# Patient Record
Sex: Female | Born: 1975 | Race: Black or African American | Hispanic: No | Marital: Married | Smoking: Former smoker
Health system: Southern US, Community
[De-identification: ages and names within clinical notes are randomized; demographics above are authoritative.]

## PROBLEM LIST (undated history)

## (undated) DIAGNOSIS — D573 Sickle-cell trait: Secondary | ICD-10-CM

## (undated) DIAGNOSIS — K219 Gastro-esophageal reflux disease without esophagitis: Secondary | ICD-10-CM

## (undated) DIAGNOSIS — E785 Hyperlipidemia, unspecified: Secondary | ICD-10-CM

## (undated) HISTORY — DX: Hyperlipidemia, unspecified: E78.5

## (undated) HISTORY — PX: OTHER SURGICAL HISTORY: SHX169

## (undated) HISTORY — DX: Gastro-esophageal reflux disease without esophagitis: K21.9

## (undated) HISTORY — DX: Sickle-cell trait: D57.3

---

## 1998-12-10 ENCOUNTER — Other Ambulatory Visit: Admission: RE | Admit: 1998-12-10 | Discharge: 1998-12-10 | Payer: Self-pay | Admitting: Obstetrics and Gynecology

## 1999-03-07 ENCOUNTER — Other Ambulatory Visit: Admission: RE | Admit: 1999-03-07 | Discharge: 1999-03-07 | Payer: Self-pay | Admitting: Obstetrics and Gynecology

## 1999-03-07 ENCOUNTER — Encounter (INDEPENDENT_AMBULATORY_CARE_PROVIDER_SITE_OTHER): Payer: Self-pay | Admitting: Specialist

## 1999-05-02 ENCOUNTER — Other Ambulatory Visit: Admission: RE | Admit: 1999-05-02 | Discharge: 1999-05-02 | Payer: Self-pay | Admitting: Obstetrics and Gynecology

## 1999-09-07 ENCOUNTER — Encounter: Admission: RE | Admit: 1999-09-07 | Discharge: 1999-09-07 | Payer: Self-pay | Admitting: Family Medicine

## 1999-10-12 ENCOUNTER — Encounter: Admission: RE | Admit: 1999-10-12 | Discharge: 1999-10-12 | Payer: Self-pay | Admitting: Family Medicine

## 2000-02-21 ENCOUNTER — Encounter: Admission: RE | Admit: 2000-02-21 | Discharge: 2000-02-21 | Payer: Self-pay | Admitting: Family Medicine

## 2000-05-03 ENCOUNTER — Encounter: Admission: RE | Admit: 2000-05-03 | Discharge: 2000-05-03 | Payer: Self-pay | Admitting: Family Medicine

## 2000-12-12 ENCOUNTER — Encounter: Admission: RE | Admit: 2000-12-12 | Discharge: 2000-12-12 | Payer: Self-pay | Admitting: Family Medicine

## 2001-03-22 ENCOUNTER — Other Ambulatory Visit: Admission: RE | Admit: 2001-03-22 | Discharge: 2001-03-22 | Payer: Self-pay | Admitting: Gynecology

## 2001-07-31 ENCOUNTER — Inpatient Hospital Stay (HOSPITAL_COMMUNITY): Admission: AD | Admit: 2001-07-31 | Discharge: 2001-07-31 | Payer: Self-pay | Admitting: *Deleted

## 2001-10-08 ENCOUNTER — Encounter (INDEPENDENT_AMBULATORY_CARE_PROVIDER_SITE_OTHER): Payer: Self-pay

## 2001-10-08 ENCOUNTER — Inpatient Hospital Stay (HOSPITAL_COMMUNITY): Admission: AD | Admit: 2001-10-08 | Discharge: 2001-10-10 | Payer: Self-pay | Admitting: *Deleted

## 2001-11-18 ENCOUNTER — Other Ambulatory Visit: Admission: RE | Admit: 2001-11-18 | Discharge: 2001-11-18 | Payer: Self-pay | Admitting: Gynecology

## 2003-01-27 ENCOUNTER — Inpatient Hospital Stay (HOSPITAL_COMMUNITY): Admission: AD | Admit: 2003-01-27 | Discharge: 2003-01-27 | Payer: Self-pay | Admitting: Gynecology

## 2003-02-13 ENCOUNTER — Inpatient Hospital Stay (HOSPITAL_COMMUNITY): Admission: AD | Admit: 2003-02-13 | Discharge: 2003-02-15 | Payer: Self-pay | Admitting: Gynecology

## 2003-03-26 ENCOUNTER — Other Ambulatory Visit: Admission: RE | Admit: 2003-03-26 | Discharge: 2003-03-26 | Payer: Self-pay | Admitting: Gynecology

## 2004-07-04 ENCOUNTER — Other Ambulatory Visit: Admission: RE | Admit: 2004-07-04 | Discharge: 2004-07-04 | Payer: Self-pay | Admitting: Gynecology

## 2004-08-25 ENCOUNTER — Emergency Department (HOSPITAL_COMMUNITY): Admission: EM | Admit: 2004-08-25 | Discharge: 2004-08-25 | Payer: Self-pay | Admitting: Emergency Medicine

## 2004-08-25 ENCOUNTER — Inpatient Hospital Stay (HOSPITAL_COMMUNITY): Admission: AD | Admit: 2004-08-25 | Discharge: 2004-08-25 | Payer: Self-pay | Admitting: Gynecology

## 2005-01-18 ENCOUNTER — Inpatient Hospital Stay (HOSPITAL_COMMUNITY): Admission: AD | Admit: 2005-01-18 | Discharge: 2005-01-18 | Payer: Self-pay | Admitting: Gynecology

## 2005-01-23 ENCOUNTER — Inpatient Hospital Stay (HOSPITAL_COMMUNITY): Admission: AD | Admit: 2005-01-23 | Discharge: 2005-01-25 | Payer: Self-pay | Admitting: Gynecology

## 2005-03-07 ENCOUNTER — Other Ambulatory Visit: Admission: RE | Admit: 2005-03-07 | Discharge: 2005-03-07 | Payer: Self-pay | Admitting: Gynecology

## 2006-03-09 ENCOUNTER — Other Ambulatory Visit: Admission: RE | Admit: 2006-03-09 | Discharge: 2006-03-09 | Payer: Self-pay | Admitting: Gynecology

## 2006-03-10 ENCOUNTER — Emergency Department (HOSPITAL_COMMUNITY): Admission: EM | Admit: 2006-03-10 | Discharge: 2006-03-10 | Payer: Self-pay | Admitting: Emergency Medicine

## 2007-03-21 ENCOUNTER — Other Ambulatory Visit: Admission: RE | Admit: 2007-03-21 | Discharge: 2007-03-21 | Payer: Self-pay | Admitting: Gynecology

## 2008-03-27 ENCOUNTER — Other Ambulatory Visit: Admission: RE | Admit: 2008-03-27 | Discharge: 2008-03-27 | Payer: Self-pay | Admitting: Gynecology

## 2009-05-10 ENCOUNTER — Other Ambulatory Visit: Admission: RE | Admit: 2009-05-10 | Discharge: 2009-05-10 | Payer: Self-pay | Admitting: Gynecology

## 2009-05-10 ENCOUNTER — Encounter: Payer: Self-pay | Admitting: Women's Health

## 2009-05-10 ENCOUNTER — Ambulatory Visit: Payer: Self-pay | Admitting: Women's Health

## 2010-05-25 ENCOUNTER — Other Ambulatory Visit
Admission: RE | Admit: 2010-05-25 | Discharge: 2010-05-25 | Payer: Self-pay | Source: Home / Self Care | Admitting: Gynecology

## 2010-05-25 ENCOUNTER — Ambulatory Visit: Payer: Self-pay | Admitting: Women's Health

## 2010-12-30 NOTE — Discharge Summary (Signed)
   Lindsay Wood, Lindsay Wood                            ACCOUNT NO.:  [id number]   MEDICAL RECORD NO.:  [id number]                   PATIENT TYPE:  INP   LOCATION:  9122                                 FACILITY:  [facility]   PHYSICIAN:  Timothy P. Fontaine, M.D.           DATE OF BIRTH:  03-03-1976   DATE OF ADMISSION:  02/13/2003  DATE OF DISCHARGE:  02/15/2003                                 DISCHARGE SUMMARY   DISCHARGE DIAGNOSIS:  Intrauterine pregnancy at term.   PROCEDURE:  Spontaneous vaginal delivery with no episiotomy or laceration.  She was augmented with Pitocin.   HISTORY OF PRESENT ILLNESS:  A 35 year old gravida 3 para 2 with an LMP of  May 05, 2002 with an [facility] of February 23, 2003; estimated gestational age  is 19 and three; with premature rupture of membranes that was clear fluid.  She did begin to contract shortly thereafter.  Progressed to complete,  delivered.   PRENATAL LABORATORY DATA:  AB positive, antibody negative.  Serology  nonreactive.  Rubella titer positive.  Hepatitis nonreactive.  HIV  nonreactive.  Group B strep negative.   HOSPITAL COURSE AND TREATMENT:  The patient presented on February 13, 2003 with  spontaneous rupture of clear fluid.  Did start to have contractions shortly  thereafter.  Had no complications during her pregnancy.  She progressed to  complete.  Had a spontaneous vaginal delivery without an episiotomy, viable  female with Apgars of 9 and 9, with a birth weight of 7 pounds 1 ounce.  She  did have a nuchal cord x1 that was reduced.  Postpartum she remained  afebrile, voiding, was discharged home in satisfactory condition on  postpartum day #2.   DISCHARGE LABORATORY DATA:  White count 13, hemoglobin 10.7, hematocrit  33.5, platelets 202.   DISPOSITION:  1. She was discharged to home.  2. Instructed to continue prenatal vitamins, iron, Motrin as needed for     pain.  3. Follow up in the office in six weeks or as needed.  4. [facility]GA discharge  booklet was given.     Davonna Belling. Young, N.P.                      Timothy P. Audie Box, M.D.    [facility]  D:  03/18/2003  T:  03/18/2003  Job:  [id number]

## 2010-12-30 NOTE — H&P (Signed)
NAMEKIMBERLYE, Lindsay Wood                ACCOUNT NO.:  [id number]   MEDICAL RECORD NO.:  [id number]          PATIENT TYPE:  INP   LOCATION:  9165                          [facility]ILITY:  [facility]   PHYSICIAN:  Ivor Costa. Farrel Gobble, M.D. DATE OF BIRTH:  Nov 08, 1975   DATE OF ADMISSION:  01/23/2005  DATE OF DISCHARGE:                                HISTORY & PHYSICAL   CHIEF COMPLAINT:  Favorable cervix.   HISTORY OF PRESENT ILLNESS:  The patient is a 35 year old G4 P3 with an LMP  of April 23, 2004 estimated date of confinement of February 02, 2005,  estimated gestational age of 27 and 4/7th weeks, who was seen twice in both  the office and triage for what turns out to be false labor.  The patient was  noted to be approximately 2 cm dilated, 50% efface, 2 hours of walking had  not changed her cervix.  She was having increasing anxiety as her last two  deliveries have been rather precipitous at 1 and 2-1/2 hours respectively.  She had also delivered her last baby at the same gestational age.  Based on  that, the patient requested an induction of labor.  Her pregnancy is  otherwise complicated only by positive GBS status.  She reports good fetal  movement, and does report contractions without vaginal bleeding.  She is AB  positive, antibody negative, RPR nonreactive, rubella immune, hepatitis B  surface antigen nonreactive, HIV nonreactive GBS positive.  Refer to  [facility].  On physical examination she is a well-appearing gravida in no acute  distress.  Her weight is 142, which is a 17 pound weight gain.  Heart was  regular rate, her lungs were clear to auscultation.  Her fundal height was  36 with heart tones auscultated.  Her exam was 2/50/- 2.  Extremities are  negative for edema.   ASSESSMENT:  History of precipitous labor x2 with increasing anxiety  secondary to false labor, who presents now for high-dose pitocin with AROM.       THL/MEDQ  D:  01/23/2005  T:  01/23/2005  Job:  [id number]

## 2010-12-30 NOTE — Discharge Summary (Signed)
[facility]  Patient:    MARIONNA, GONIA Visit Number: [id number] MRN: [id number]          Service Type: OBS Location: 910A 9112 01 Attending Physician:  Tonye Royalty Dictated by:   Antony Contras, [facility] Admit Date:  10/08/2001 Discharge Date: 10/10/2001                             Discharge Summary  DISCHARGE DIAGNOSES:          1. Intrauterine pregnancy at term.                               2. Spontaneous onset of labor.  PROCEDURE:                    Normal spontaneous vaginal delivery of a                               viable infant over intact perineum.  HISTORY OF PRESENT ILLNESS:   The patient is a 35 year old gravida 2, para 1-0-0-1, LMP Jan 07, 2001, [facility] October 16, 2001.  Prenatal risk factor history, positive GBS with previous pregnancy.  ADMISSION LABORATORY DATA:    Blood type AB positive.  Antibody screen negative.  Sickle cell negative.  RPR, HBsAG, HIV nonreactive.  Rubella immune.  The patient declined MSaFP.  HOSPITAL COURSE:              The patient was admitted October 08, 2001, in advanced active labor.  She progressed rapidly to complete dilatation and delivery of an Apgar 8 and 9, 6 pound 3 ounce female infant over an intact perineum.  Nuchal cord x1 reduced.  There were no tears.  The fluid was meconium-stained.  Pediatric team in attendance.  Postpartum course:  The patient remained afebrile, had no difficulty voiding, was able to be discharged in satisfactory condition on her second postpartum day.  CBC: Hematocrit 30.4, hemoglobin 10.1, WBC 12.1, platelets 201.  DISPOSITION:                  Follow up in six weeks.  Continue prenatal vitamins, Motrin for pain. Dictated by:   Antony Contras, [facility] Attending Physician:  Tonye Royalty DD:  10/23/01 TD:  10/25/01 Job: [phone] [id number]

## 2013-06-23 ENCOUNTER — Other Ambulatory Visit: Payer: Self-pay | Admitting: Family Medicine

## 2013-06-23 ENCOUNTER — Other Ambulatory Visit (HOSPITAL_COMMUNITY)
Admission: RE | Admit: 2013-06-23 | Discharge: 2013-06-23 | Disposition: A | Discharge status: Home / Self Care | Payer: Self-pay | Source: Ambulatory Visit | Attending: Family Medicine | Admitting: Family Medicine

## 2013-06-23 DIAGNOSIS — Z124 Encounter for screening for malignant neoplasm of cervix: Secondary | ICD-10-CM | POA: Insufficient documentation

## 2016-05-31 ENCOUNTER — Other Ambulatory Visit: Payer: Self-pay | Admitting: Physician Assistant

## 2016-05-31 ENCOUNTER — Other Ambulatory Visit (HOSPITAL_COMMUNITY)
Admission: RE | Admit: 2016-05-31 | Discharge: 2016-05-31 | Disposition: A | Discharge status: Home / Self Care | Payer: Medicaid Other | Source: Ambulatory Visit | Attending: Physician Assistant | Admitting: Physician Assistant

## 2016-05-31 DIAGNOSIS — Z01419 Encounter for gynecological examination (general) (routine) without abnormal findings: Secondary | ICD-10-CM | POA: Insufficient documentation

## 2016-05-31 DIAGNOSIS — Z1151 Encounter for screening for human papillomavirus (HPV): Secondary | ICD-10-CM | POA: Insufficient documentation

## 2016-06-02 LAB — CYTOLOGY - PAP
DIAGNOSIS: NEGATIVE
HPV: NOT DETECTED

## 2016-06-13 ENCOUNTER — Other Ambulatory Visit: Payer: Self-pay | Admitting: Physician Assistant

## 2016-06-13 DIAGNOSIS — Z1231 Encounter for screening mammogram for malignant neoplasm of breast: Secondary | ICD-10-CM

## 2016-07-13 ENCOUNTER — Ambulatory Visit
Admission: RE | Admit: 2016-07-13 | Discharge: 2016-07-13 | Disposition: A | Discharge status: Home / Self Care | Payer: Medicaid Other | Source: Ambulatory Visit | Attending: Physician Assistant | Admitting: Physician Assistant

## 2016-07-13 DIAGNOSIS — Z1231 Encounter for screening mammogram for malignant neoplasm of breast: Secondary | ICD-10-CM

## 2016-11-15 ENCOUNTER — Ambulatory Visit (INDEPENDENT_AMBULATORY_CARE_PROVIDER_SITE_OTHER): Payer: Medicaid Other | Admitting: Podiatry

## 2016-11-15 ENCOUNTER — Ambulatory Visit (INDEPENDENT_AMBULATORY_CARE_PROVIDER_SITE_OTHER): Payer: Medicaid Other

## 2016-11-15 ENCOUNTER — Encounter: Payer: Self-pay | Admitting: Podiatry

## 2016-11-15 VITALS — Ht 63.0 in | Wt 149.0 lb

## 2016-11-15 DIAGNOSIS — G8929 Other chronic pain: Secondary | ICD-10-CM | POA: Diagnosis not present

## 2016-11-15 DIAGNOSIS — M79672 Pain in left foot: Secondary | ICD-10-CM

## 2016-11-15 DIAGNOSIS — L84 Corns and callosities: Secondary | ICD-10-CM

## 2016-11-15 DIAGNOSIS — M79671 Pain in right foot: Secondary | ICD-10-CM | POA: Diagnosis not present

## 2016-11-15 DIAGNOSIS — L851 Acquired keratosis [keratoderma] palmaris et plantaris: Secondary | ICD-10-CM | POA: Diagnosis not present

## 2016-11-15 DIAGNOSIS — M79674 Pain in right toe(s): Secondary | ICD-10-CM | POA: Diagnosis not present

## 2016-11-18 NOTE — Progress Notes (Signed)
   Subjective: Patient presents to the office today for chief complaint of painful callus lesions of the right fifth toe. Patient states that the pain is ongoing for several years but has worsened over the past 4 months and is affecting their ability to ambulate without pain. She has tried to wear pads over the corn as treatment. Patient presents today for further treatment and evaluation.  Objective:  Physical Exam General: Alert and oriented x3 in no acute distress  Dermatology: Hyperkeratotic lesion present on the fifth digit right foot. Pain on palpation with a central nucleated core noted.  Skin is warm, dry and supple bilateral lower extremities. Negative for open lesions or macerations.  Vascular: Palpable pedal pulses bilaterally. No edema or erythema noted. Capillary refill within normal limits.  Neurological: Epicritic and protective threshold grossly intact bilaterally.   Musculoskeletal Exam: Pain on palpation at the keratotic lesion noted. Range of motion within normal limits bilateral. Muscle strength 5/5 in all groups bilateral.  Radiographic exam: Joint spaces preserved. No sign of osteolytic or degenerative process. Negative for fracture.  Assessment: #1 Corn- right fifth toe   Plan of Care:  #1 Patient evaluated #2 Excisional debridement of  keratoic lesion using a chisel blade was performed without incident.  #3 Treated area(s) with Salinocaine and dressed with light dressing. #4 Patient is to return to the clinic PRN.   Edrick Kins, DPM [facility]  Dr. Edrick Kins, [address]                                        [place], [place] [address]                Office [phone]  Fax [phone]

## 2017-06-08 ENCOUNTER — Other Ambulatory Visit: Payer: Self-pay | Admitting: Physician Assistant

## 2017-06-08 DIAGNOSIS — Z1231 Encounter for screening mammogram for malignant neoplasm of breast: Secondary | ICD-10-CM

## 2017-07-16 ENCOUNTER — Ambulatory Visit
Admission: RE | Admit: 2017-07-16 | Discharge: 2017-07-16 | Disposition: A | Discharge status: Home / Self Care | Payer: Medicaid Other | Source: Ambulatory Visit | Attending: Physician Assistant | Admitting: Physician Assistant

## 2017-07-16 DIAGNOSIS — Z1231 Encounter for screening mammogram for malignant neoplasm of breast: Secondary | ICD-10-CM

## 2018-06-21 ENCOUNTER — Other Ambulatory Visit: Payer: Self-pay | Admitting: Physician Assistant

## 2018-06-21 DIAGNOSIS — Z1231 Encounter for screening mammogram for malignant neoplasm of breast: Secondary | ICD-10-CM

## 2018-07-26 ENCOUNTER — Ambulatory Visit
Admission: RE | Admit: 2018-07-26 | Discharge: 2018-07-26 | Disposition: A | Discharge status: Home / Self Care | Payer: Medicaid Other | Source: Ambulatory Visit | Attending: Physician Assistant | Admitting: Physician Assistant

## 2018-07-26 DIAGNOSIS — Z1231 Encounter for screening mammogram for malignant neoplasm of breast: Secondary | ICD-10-CM

## 2018-07-29 ENCOUNTER — Other Ambulatory Visit: Payer: Self-pay | Admitting: Physician Assistant

## 2018-07-29 DIAGNOSIS — R928 Other abnormal and inconclusive findings on diagnostic imaging of breast: Secondary | ICD-10-CM

## 2018-08-08 ENCOUNTER — Ambulatory Visit
Admission: RE | Admit: 2018-08-08 | Discharge: 2018-08-08 | Disposition: A | Discharge status: Home / Self Care | Payer: Medicaid Other | Source: Ambulatory Visit | Attending: Physician Assistant | Admitting: Physician Assistant

## 2018-08-08 ENCOUNTER — Other Ambulatory Visit: Payer: Self-pay | Admitting: Physician Assistant

## 2018-08-08 DIAGNOSIS — R928 Other abnormal and inconclusive findings on diagnostic imaging of breast: Secondary | ICD-10-CM

## 2018-08-08 DIAGNOSIS — R2231 Localized swelling, mass and lump, right upper limb: Secondary | ICD-10-CM

## 2018-08-12 ENCOUNTER — Ambulatory Visit
Admission: RE | Admit: 2018-08-12 | Discharge: 2018-08-12 | Disposition: A | Discharge status: Home / Self Care | Payer: Medicaid Other | Source: Ambulatory Visit | Attending: Physician Assistant | Admitting: Physician Assistant

## 2018-08-12 ENCOUNTER — Other Ambulatory Visit (HOSPITAL_COMMUNITY)
Admission: RE | Admit: 2018-08-12 | Discharge: 2018-08-12 | Disposition: A | Discharge status: Home / Self Care | Payer: Medicaid Other | Source: Ambulatory Visit | Attending: Physician Assistant | Admitting: Physician Assistant

## 2018-08-12 DIAGNOSIS — R2231 Localized swelling, mass and lump, right upper limb: Secondary | ICD-10-CM

## 2018-08-12 HISTORY — PX: BREAST BIOPSY: SHX20

## 2019-02-12 ENCOUNTER — Other Ambulatory Visit: Payer: Self-pay | Admitting: Physician Assistant

## 2019-02-12 DIAGNOSIS — N6310 Unspecified lump in the right breast, unspecified quadrant: Secondary | ICD-10-CM

## 2019-02-20 ENCOUNTER — Other Ambulatory Visit: Payer: Self-pay | Admitting: Physician Assistant

## 2019-02-20 ENCOUNTER — Ambulatory Visit
Admission: RE | Admit: 2019-02-20 | Discharge: 2019-02-20 | Disposition: A | Discharge status: Home / Self Care | Payer: Medicaid Other | Source: Ambulatory Visit | Attending: Physician Assistant | Admitting: Physician Assistant

## 2019-02-20 DIAGNOSIS — N6310 Unspecified lump in the right breast, unspecified quadrant: Secondary | ICD-10-CM

## 2019-02-20 DIAGNOSIS — R2231 Localized swelling, mass and lump, right upper limb: Secondary | ICD-10-CM

## 2019-02-27 ENCOUNTER — Ambulatory Visit
Admission: RE | Admit: 2019-02-27 | Discharge: 2019-02-27 | Disposition: A | Discharge status: Home / Self Care | Payer: Medicaid Other | Source: Ambulatory Visit | Attending: Physician Assistant | Admitting: Physician Assistant

## 2019-02-27 ENCOUNTER — Other Ambulatory Visit: Payer: Self-pay | Admitting: Physician Assistant

## 2019-02-27 ENCOUNTER — Other Ambulatory Visit: Payer: Self-pay

## 2019-02-27 DIAGNOSIS — R2231 Localized swelling, mass and lump, right upper limb: Secondary | ICD-10-CM

## 2019-02-27 HISTORY — PX: BREAST BIOPSY: SHX20

## 2019-08-18 ENCOUNTER — Other Ambulatory Visit: Payer: Self-pay | Admitting: Physician Assistant

## 2019-08-18 DIAGNOSIS — Z1231 Encounter for screening mammogram for malignant neoplasm of breast: Secondary | ICD-10-CM

## 2019-08-20 ENCOUNTER — Ambulatory Visit
Admission: RE | Admit: 2019-08-20 | Discharge: 2019-08-20 | Disposition: A | Discharge status: Home / Self Care | Payer: Medicaid Other | Source: Ambulatory Visit | Attending: Physician Assistant | Admitting: Physician Assistant

## 2019-08-20 ENCOUNTER — Other Ambulatory Visit: Payer: Self-pay

## 2019-08-20 DIAGNOSIS — Z1231 Encounter for screening mammogram for malignant neoplasm of breast: Secondary | ICD-10-CM

## 2020-06-04 IMAGING — MG DIGITAL SCREENING BILATERAL MAMMOGRAM WITH TOMO AND CAD
8 series · 9 of 24 positions shown · non-contrast
Comparison: Previous exam(s).

CLINICAL DATA: Screening.

EXAM:
DIGITAL SCREENING BILATERAL MAMMOGRAM WITH TOMO AND CAD

[R CC synth-2D]
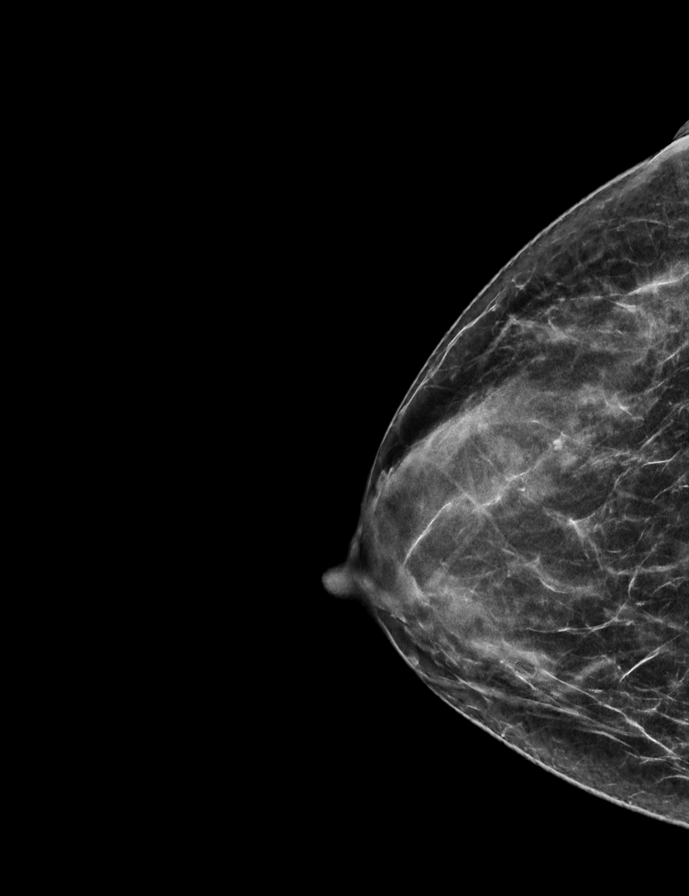

[R MLO synth-2D]
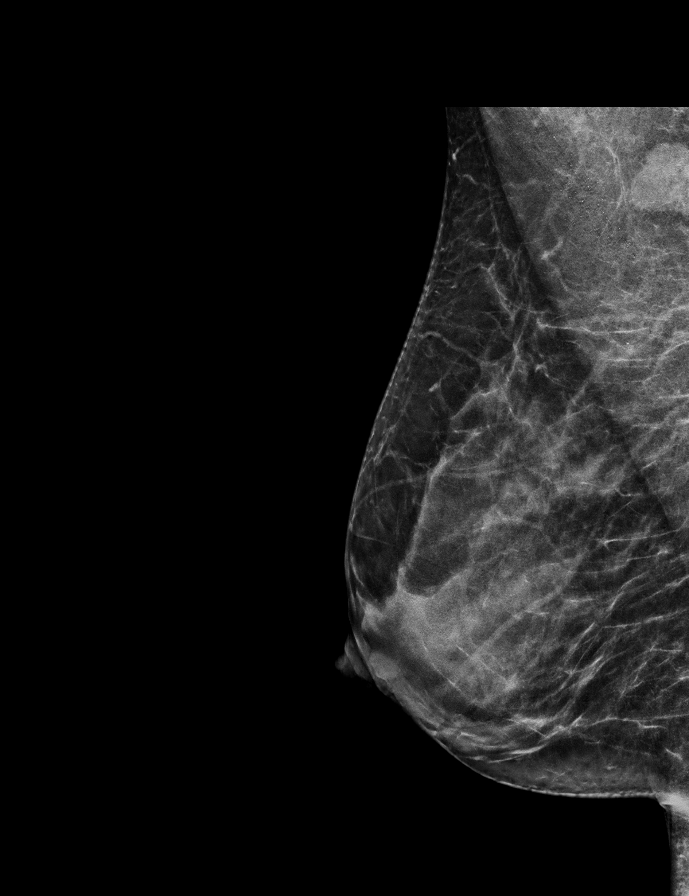

[L MLO synth-2D]
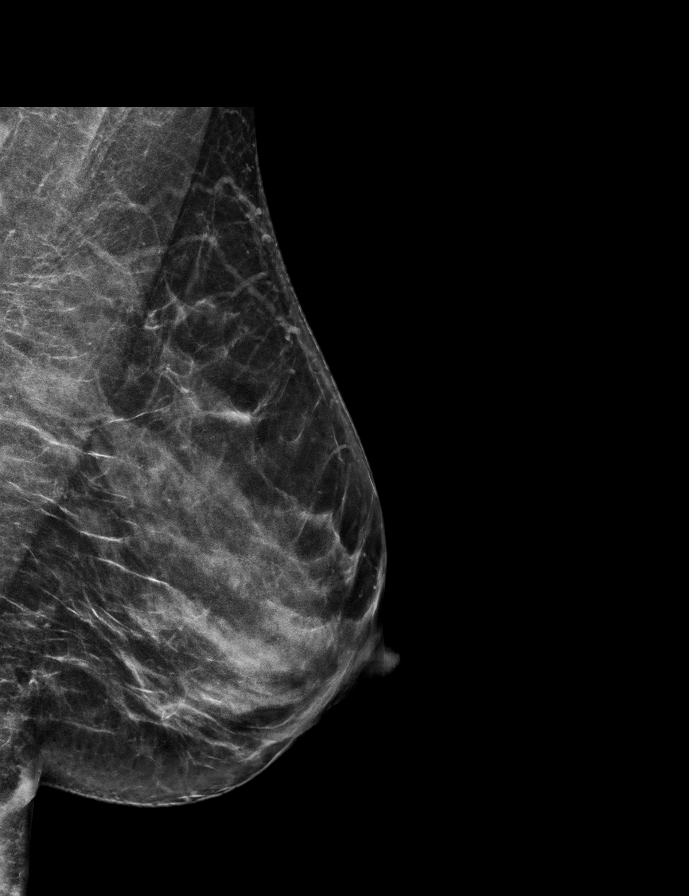

[L CC synth-2D]
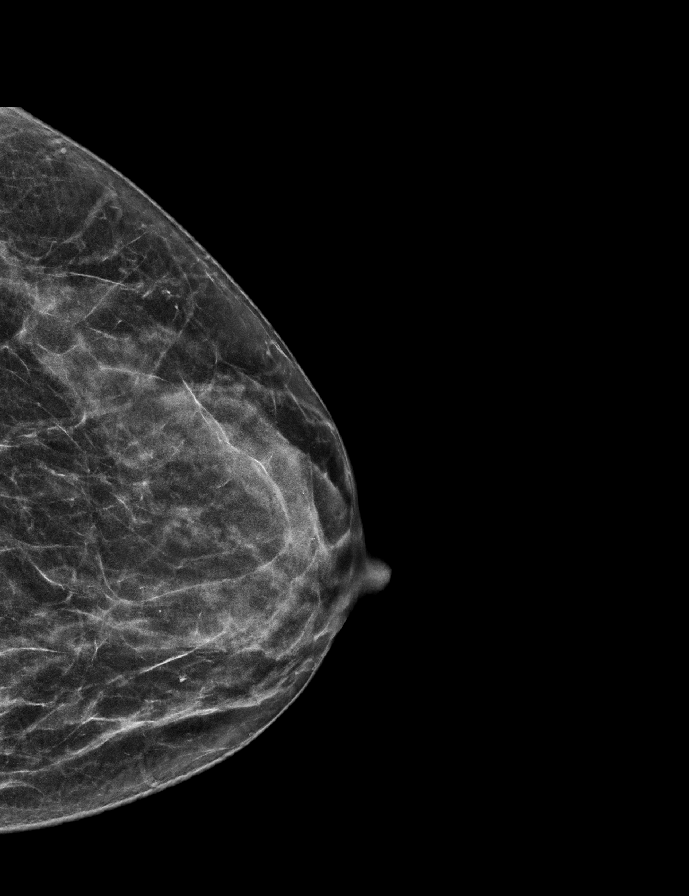

[L CC tomo · 2 of 54 frames shown]
[frame 18/54]
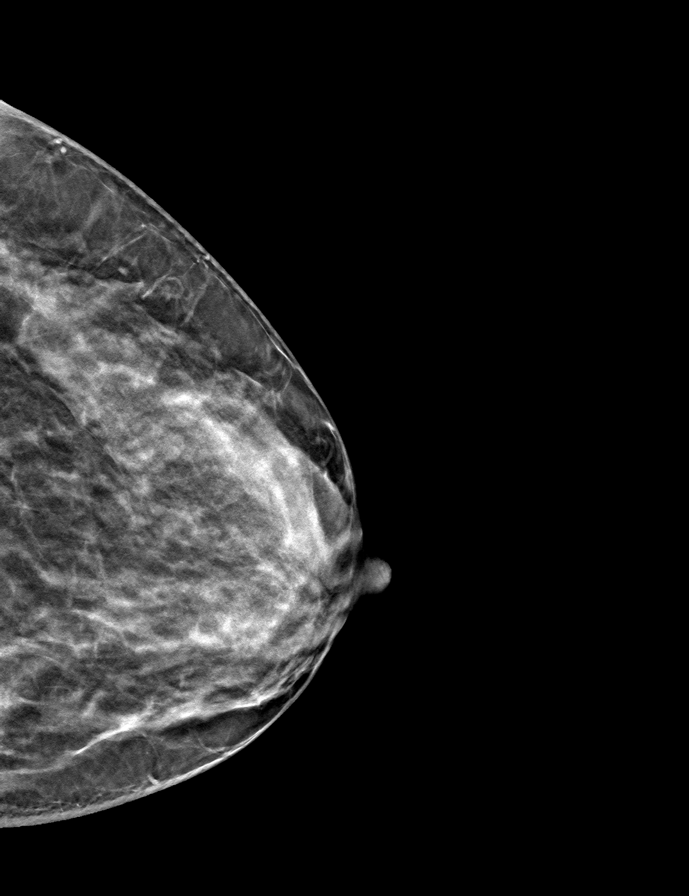
[frame 27/54]
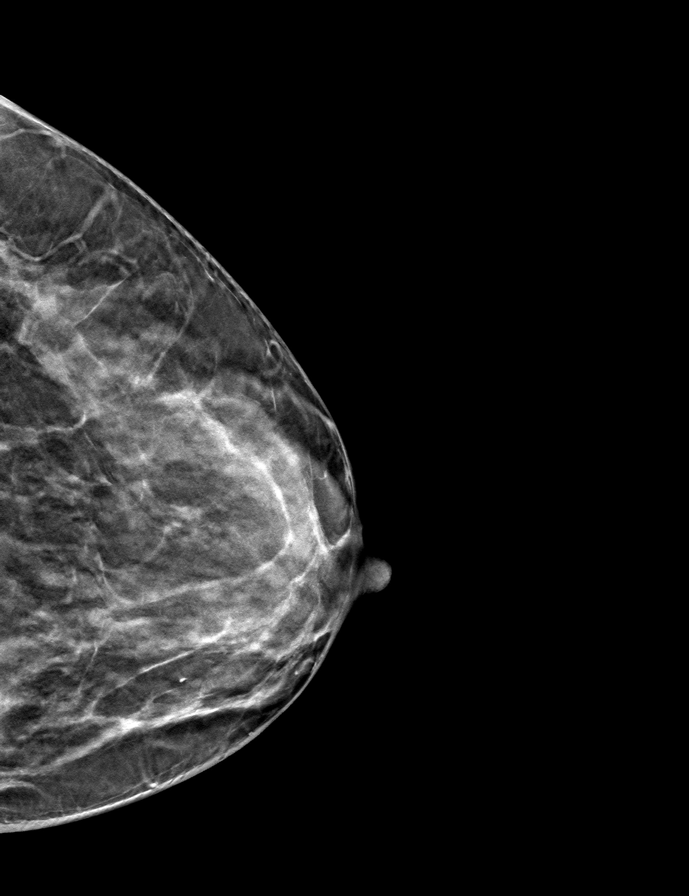

[L MLO tomo · tomo slice 33/65.0]
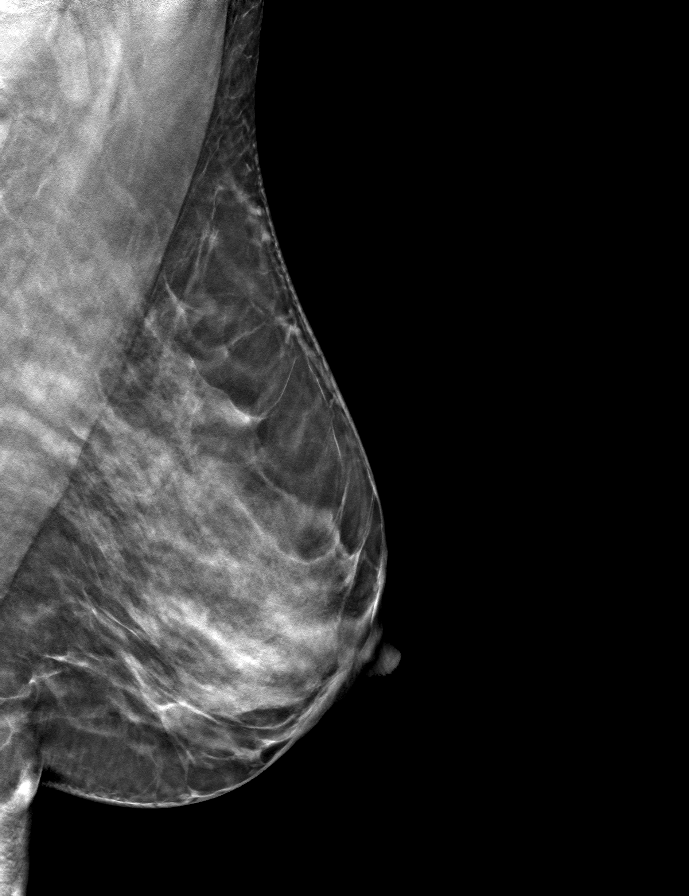

[R MLO tomo · tomo slice 31/62.0]
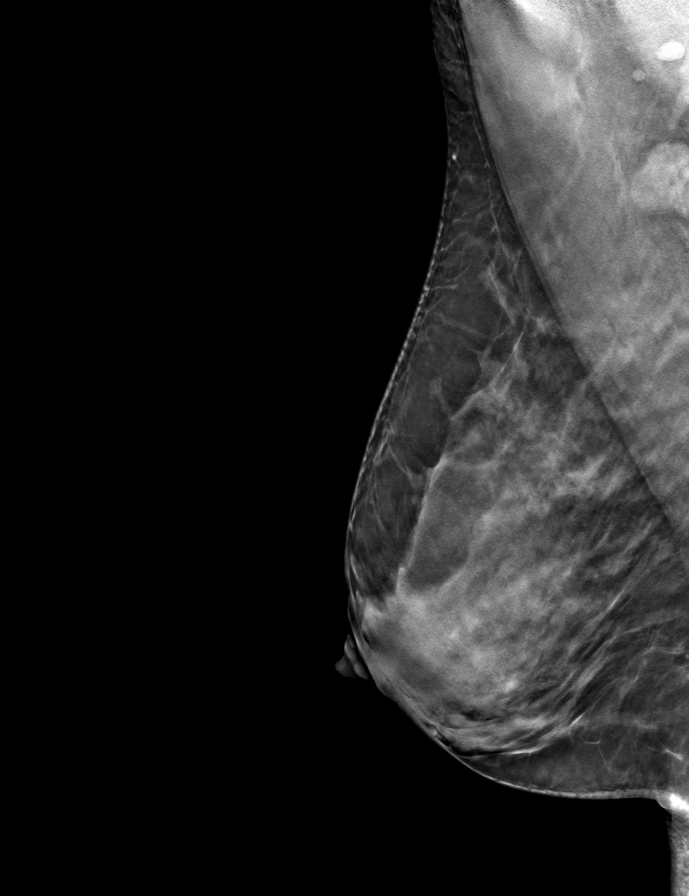

[R CC tomo · tomo slice 29/57.0]
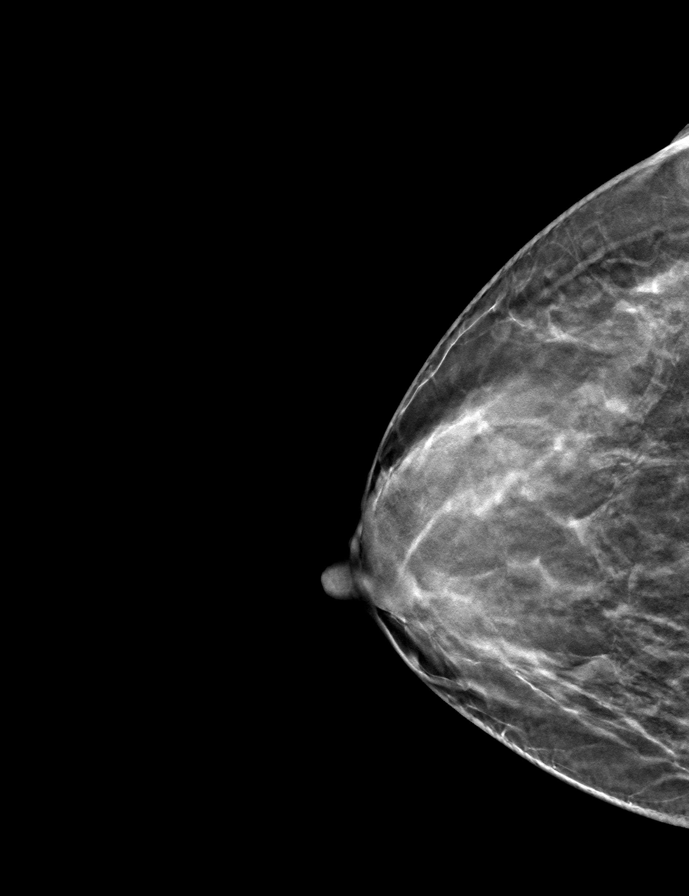

[9 of 24 positions shown; findings below may reference images not displayed]

ACR Breast Density Category c: The breast tissue is heterogeneously
dense, which may obscure small masses.
FINDINGS: In the right breast, a possible mass warrants further evaluation. In
the left breast, no findings suspicious for malignancy. Images were
processed with CAD.
IMPRESSION: Further evaluation is suggested for possible mass in the right
breast.

RECOMMENDATION:
Diagnostic mammogram and possibly ultrasound of the right breast.
(Code:[id number])

The patient will be contacted regarding the findings, and additional
imaging will be scheduled.

BI-RADS CATEGORY  0: Incomplete. Need additional imaging evaluation
and/or prior mammograms for comparison.

## 2020-10-18 ENCOUNTER — Other Ambulatory Visit: Payer: Self-pay | Admitting: Physician Assistant

## 2020-10-18 DIAGNOSIS — Z1231 Encounter for screening mammogram for malignant neoplasm of breast: Secondary | ICD-10-CM

## 2020-12-09 ENCOUNTER — Other Ambulatory Visit: Payer: Self-pay

## 2020-12-09 ENCOUNTER — Ambulatory Visit
Admission: RE | Admit: 2020-12-09 | Discharge: 2020-12-09 | Disposition: A | Discharge status: Home / Self Care | Payer: Medicaid Other | Source: Ambulatory Visit | Attending: Physician Assistant | Admitting: Physician Assistant

## 2020-12-09 DIAGNOSIS — Z1231 Encounter for screening mammogram for malignant neoplasm of breast: Secondary | ICD-10-CM

## 2021-01-07 ENCOUNTER — Ambulatory Visit: Payer: Medicaid Other | Attending: Orthopaedic Surgery

## 2021-01-07 ENCOUNTER — Other Ambulatory Visit: Payer: Self-pay

## 2021-01-07 DIAGNOSIS — M2241 Chondromalacia patellae, right knee: Secondary | ICD-10-CM | POA: Insufficient documentation

## 2021-01-07 DIAGNOSIS — M222X1 Patellofemoral disorders, right knee: Secondary | ICD-10-CM | POA: Insufficient documentation

## 2021-01-07 DIAGNOSIS — M6281 Muscle weakness (generalized): Secondary | ICD-10-CM | POA: Diagnosis present

## 2021-01-07 DIAGNOSIS — R600 Localized edema: Secondary | ICD-10-CM | POA: Insufficient documentation

## 2021-01-07 NOTE — Therapy (Signed)
[place], [place], [address] Phone: [phone]   Fax:  [phone]  Physical Therapy Evaluation  Patient Details  Name: Lindsay Wood MRN: [id number] Date of Birth: 10-09-1975 Referring Provider (PT): Melrose Nakayama, MD   Encounter Date: 01/07/2021   PT End of Session - 01/07/21 0924    Visit Number 1    Number of Visits 8    Date for PT Re-Evaluation 02/26/21    Authorization Type  MEDICAID HEALTHY BLUE    PT Start Time 0800    PT Stop Time 0850    PT Time Calculation (min) 50 min    Activity Tolerance Patient tolerated treatment well    Behavior During Therapy [facility] for tasks assessed/performed           History reviewed. No pertinent past medical history.  Past Surgical History:  Procedure Laterality Date  . BREAST BIOPSY Right 08/12/2018   benign  . BREAST BIOPSY Right 02/27/2019   benign    There were no vitals filed for this visit.    Subjective Assessment - 01/07/21 0813    Subjective Pt reports approx 2 month onset s a specific MOI. DIscribes as a catch c jolt like pain which is brief.    Limitations Walking   Dance   How long can you stand comfortably? not an issue    How long can you walk comfortably? .5 mile    Diagnostic tests By Dr. Rhona Raider, pt reports  it showed inflamation behind the knee cap    Patient Stated Goals To move the right knee without pain and full strength    Currently in Pain? No/denies    Pain Score 6     Pain Location Knee    Pain Orientation Right    Pain Descriptors / Indicators Aching;Sharp    Pain Type Chronic pain    Pain Onset More than a month ago    Pain Frequency Intermittent    Aggravating Factors  Sometimes up/down steps, squats, lunges, extended walking    Pain Relieving Factors Rest    Effect of Pain on Daily Activities Limits activity and how much I bent my knee              [facility] PT Assessment - 01/07/21 0001      Assessment    Medical Diagnosis R Knee chondromalacia patella    Referring Provider (PT) Melrose Nakayama, MD    Onset Date/Surgical Date --   2 months   Hand Dominance Right    Next MD Visit 4-6 weeks    Prior Therapy no      Precautions   Precautions None      Restrictions   Weight Bearing Restrictions Yes      Balance Screen   Has the patient fallen in the past 6 months No      [place] residence    Living Arrangements Spouse/significant other;Children    Type of [place] to enter    Entrance Stairs-Number of Steps 1    Entrance Stairs-Rails None    Home Layout Two level    Alternate Level Stairs-Number of Steps 15    Alternate Level Stairs-Rails Left      Prior Function   Level of Independence Independent    Vocation Part time employment;Student    [employer]; Dance Equities trader  Overall Cognitive Status Within Functional Limits for tasks assessed      Observation/Other Assessments   Focus on Therapeutic Outcomes (FOTO)  NA      Posture/Postural Control   Posture/Postural Control No significant limitations      ROM / Strength   AROM / PROM / Strength AROM;Strength      AROM   AROM Assessment Site Knee    Right/Left Knee Right;Left    Right Knee Extension --   WNLs   Right Knee Flexion --   WNLs   Left Knee Extension --   WNLs   Left Knee Flexion --   WNLs     Strength   Strength Assessment Site Knee;Hip    Right/Left Hip Right;Left    Right Hip Flexion 4-/5    Right Hip Extension 4/5    Right Hip External Rotation  4+/5    Right Hip Internal Rotation 4+/5    Right Hip ABduction 4/5    Right Hip ADduction 4+/5    Left Hip Flexion 4+/5    Left Hip Extension 4+/5    Left Hip External Rotation 5/5    Left Hip Internal Rotation 5/5    Left Hip ABduction 5/5    Left Hip ADduction 5/5    Right/Left Knee Right;Left    Right Knee Flexion 5/5    Right Knee  Extension 5/5    Left Knee Flexion 5/5    Left Knee Extension 5/5      Palpation   Patella mobility WNLs for all motions      Special Tests    Special Tests Laxity/Instability Tests;Knee Special Tests    Other special tests MCL and LCL laity test is negative R    Laxity/Instability  Anterior drawer test;Posterior drawer test    Knee Special tests  McConnell Test;Patellofemoral Apprehension Test;Patellofemoral Grind Test (Clarke's Sign)      Anterior drawer test   Findings Negative    Side Right      Posterior drawer test   Findings Negative    Side  Right      McConnell Test   Findings Negative    Side Right      Patellofemoral Apprehension Test    Findings Positive    Side  Right      Patellofemoral Grind test (Clark's Sign)   Findings Postive    Side  Right      Transfers   Transfers Sit to Stand;Stand to Sit    Sit to Stand 7: Independent      Ambulation/Gait   Ambulation/Gait Yes    Ambulation/Gait Assistance 7: Independent    Gait Pattern Step-through pattern                      Objective measurements completed on examination: See above findings.       [place] Adult PT Treatment/Exercise - 01/07/21 0001      Exercises   Exercises Knee/Hip      Knee/Hip Exercises: Stretches   Active Hamstring Stretch 2 reps;Right;20 seconds    Other Knee/Hip Stretches Gastroc stretch; 2x; 20 sec      Knee/Hip Exercises: Supine   Straight Leg Raises Right;10 reps   2 sec   Straight Leg Raises Limitations quad set prior to completing      Knee/Hip Exercises: Sidelying   Hip ABduction Right;10 reps   2 sec   Hip ADduction Right;10 reps   2 sec     Knee/Hip Exercises: Prone  Straight Leg Raises Right;10 reps   2 sec                 PT Education - 01/07/21 0924    Education Details Eval findings, POC, HEP, RICE for symptom management    Person(s) Educated Patient    Methods Explanation;Demonstration;Tactile cues;Verbal cues;Handout     Comprehension Verbalized understanding;Returned demonstration;Verbal cues required;Tactile cues required            PT Short Term Goals - 01/07/21 0938      PT SHORT TERM GOAL #1   Title Pt will be Ind in an initial HEPstrated on eval    Status New    Target Date 01/28/21      PT SHORT TERM GOAL #2   Title Pt will voice understanding of measures to assist in the reduction of R knee pain    Status New    Target Date 01/28/21             PT Long Term Goals - 01/07/21 1241      PT LONG TERM GOAL #1   Title Improve R hip strength to 4+ or greater strength for R knee pain reduction and to improve fucntional mobility    Baseline see flowsheets    Status New    Target Date 02/26/21      PT LONG TERM GOAL #2   Title With the functional activities of dance, squats, asc/dsc steps pt will reports a decrease in R knee pain to 2 or less intermittently    Baseline 6/10    Status New    Target Date 02/26/21      PT LONG TERM GOAL #3   Title Pt will be Ind in a final HEP to maintain or progress pt's achieved level of function    Status New    Target Date 02/26/21                  Plan - 01/07/21 0900    Clinical Impression Statement Pt presents with a 2 month Hx of intermittent anterior patellofemoral pain. 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 Crepitus was noted with squats, and both the Clark's sign and patellar apprehension tests were positive. With MMT weakness of the R hip is noted. Pt was started on a HEP for LE strength and flexibility. Pt will benefit from PT 2w2, 1w4 to increase R knee strength and flexibility; and to decrease pain to optimize functional use of the R knee.    Examination-Activity Limitations Squat;Stairs    Examination-Participation Restrictions Occupation;School    Stability/Clinical Decision Making  Stable/Uncomplicated    Clinical Decision Making Low    Rehab Potential Excellent    PT Frequency 2x / week    PT Duration 2 weeks   then 1w4   PT Treatment/Interventions ADLs/Self Care Home Management;Cryotherapy;Electrical Stimulation;Iontophoresis 4mg /ml Dexamethasone;Moist Heat;Ultrasound;Therapeutic exercise;Therapeutic activities;Stair training;Functional mobility training;Gait training;Patient/family education;Manual techniques;Dry needling;Taping;Vasopneumatic Device;Joint Manipulations    PT Next Visit Plan Assess response to HEP. Progress ther ex as indicated. 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   Gar Ponto MS, PT 01/07/21 9:13 PM  [facility] [address] [place], [place], [address] Phone: [phone]   Fax:  [phone]  Name: Lindsay Wood MRN: [id number] Date of Birth: November 27, 1975   Check all possible CPT codes: 39179- Therapeutic Exercise, [phone] - Gait Training, [phone] - Manual Therapy, 70230 - Therapeutic Activities, [id number] - [place], [phone] - Electrical stimulation (Manual) and [id number] - Ultrasound

## 2021-01-11 ENCOUNTER — Ambulatory Visit: Payer: Medicaid Other

## 2021-01-11 ENCOUNTER — Other Ambulatory Visit: Payer: Self-pay

## 2021-01-11 DIAGNOSIS — M2241 Chondromalacia patellae, right knee: Secondary | ICD-10-CM

## 2021-01-11 DIAGNOSIS — M222X1 Patellofemoral disorders, right knee: Secondary | ICD-10-CM

## 2021-01-11 DIAGNOSIS — M6281 Muscle weakness (generalized): Secondary | ICD-10-CM

## 2021-01-11 NOTE — Therapy (Signed)
[place], [place], [address] Phone: [phone]   Fax:  [phone]  Physical Therapy Treatment  Patient Details  Name: Lindsay Wood MRN: [id number] Date of Birth: 1976-03-18 Referring Provider (PT): Melrose Nakayama, MD   Encounter Date: 01/11/2021   PT End of Session - 01/11/21 0834    Visit Number 2    Number of Visits 8    Date for PT Re-Evaluation 02/26/21    PT Start Time 0815   pt arrived late   PT Stop Time 0849    PT Time Calculation (min) 34 min    Activity Tolerance Patient tolerated treatment well;No increased pain    Behavior During Therapy [facility] for tasks assessed/performed           No past medical history on file.  Past Surgical History:  Procedure Laterality Date  . BREAST BIOPSY Right 08/12/2018   benign  . BREAST BIOPSY Right 02/27/2019   benign    There were no vitals filed for this visit.   Subjective Assessment - 01/11/21 0818    Subjective Lindsay Wood reports that she is doing her exercises at home.  She feels her knee is a little bit more stable.  Her pain increases with prolong standing and walking.  She notices a catching under the knee cap at times.    Limitations Standing;Walking    Currently in Pain? No/denies                             [facility] Adult PT Treatment/Exercise - 01/11/21 0001      Knee/Hip Exercises: Stretches   Active Hamstring Stretch 20 seconds;3 reps;Both    Other Knee/Hip Stretches Gastroc stretch; 3x; 20 sec      Knee/Hip Exercises: Aerobic   Recumbent Bike 5 min level 1      Knee/Hip Exercises: Seated   Ball Squeeze --   with LAQ 10 x 2 both     Knee/Hip Exercises: Supine   Bridges with Clamshell Strengthening;Both;15 reps;Other (comment)   Green Tband 5s hold   Straight Leg Raises Right;10 reps;2 sets   2 sec   Straight Leg Raises Limitations quad set prior to completing      Knee/Hip Exercises: Sidelying   Hip ABduction  Right;10 reps;2 sets   2 sec   Hip ADduction Right;10 reps   2 sec     Knee/Hip Exercises: Prone   Straight Leg Raises Right;10 reps;2 sets   2 sec     Manual Therapy   Manual Therapy Joint mobilization    Manual therapy comments Right patella mobs all directions grade III                  PT Education - 01/11/21 0856    Education Details Reveiw of HEP.  The patient to continue with her initial HEP at this time.    Person(s) Educated Patient    Methods Explanation;Verbal cues    Comprehension Verbalized understanding            PT Short Term Goals - 01/11/21 0855      PT SHORT TERM GOAL #1   Status On-going      PT SHORT TERM GOAL #2   Status On-going             PT Long Term Goals - 01/07/21 1241      PT LONG TERM GOAL #1   Title Improve  R hip strength to 4+ or greater strength for R knee pain reduction and to improve fucntional mobility    Baseline see flowsheets    Status New    Target Date 02/26/21      PT LONG TERM GOAL #2   Title With the functional activities of dance, squats, asc/dsc steps pt will reports a decrease in R knee pain to 2 or less intermittently    Baseline 6/10    Status New    Target Date 02/26/21      PT LONG TERM GOAL #3   Title Pt will be Ind in a final HEP to maintain or progress pt's achieved level of function    Status New    Target Date 02/26/21                 Plan - 01/11/21 0851    Clinical Impression Statement Lindsay Wood arrived to therapy reporting compliance with her HEP.   Her HEP was reveiwed today and she was able to perform exercises correctly and without pain aggravation.  Advanced therapy with bike, patella mobs, bridges w/ clamshell, and LAQ with ball squeeze.  Her HEP was not progressed today.  The patient was advised to continue with her initial HEP.  The patient would benefit from continued therapy for further strengthening, patella mobs, LE stretches, and functional strengthening for prolong standing,  walking, stairs/squatting, and other ADLs.    PT Treatment/Interventions ADLs/Self Care Home Management;Cryotherapy;Electrical Stimulation;Iontophoresis 4mg /ml Dexamethasone;Moist Heat;Ultrasound;Therapeutic exercise;Therapeutic activities;Stair training;Functional mobility training;Gait training;Patient/family education;Manual techniques;Dry needling;Taping;Vasopneumatic Device;Joint Manipulations    PT Next Visit Plan Progress HEP, CKC strengthening as tolerated, and continue with stretches/gentle joint mobs for swelling/edema    PT Home Exercise Plan [id number]           Patient will benefit from skilled therapeutic intervention in order to improve the following deficits and impairments:  Decreased activity tolerance,Pain,Decreased strength  Visit Diagnosis: Muscle weakness (generalized)  Chondromalacia patellae of right knee  Patellofemoral disorder of right knee     Problem List There are no problems to display for this patient.  Rich Number, PT, DPT, OCS, Crt. DN Bethena Midget 01/11/2021, 8:58 AM  [facility] [address] [place], [place], [address] Phone: [phone]   Fax:  [phone]  Name: Lindsay Wood MRN: [id number] Date of Birth: Feb 28, 1976

## 2021-01-14 ENCOUNTER — Other Ambulatory Visit: Payer: Self-pay

## 2021-01-14 ENCOUNTER — Ambulatory Visit: Payer: Medicaid Other | Attending: Orthopaedic Surgery

## 2021-01-14 DIAGNOSIS — G8929 Other chronic pain: Secondary | ICD-10-CM | POA: Diagnosis present

## 2021-01-14 DIAGNOSIS — M25562 Pain in left knee: Secondary | ICD-10-CM | POA: Insufficient documentation

## 2021-01-14 DIAGNOSIS — M25561 Pain in right knee: Secondary | ICD-10-CM | POA: Insufficient documentation

## 2021-01-14 DIAGNOSIS — R262 Difficulty in walking, not elsewhere classified: Secondary | ICD-10-CM | POA: Diagnosis present

## 2021-01-14 DIAGNOSIS — M2241 Chondromalacia patellae, right knee: Secondary | ICD-10-CM | POA: Diagnosis not present

## 2021-01-14 DIAGNOSIS — R600 Localized edema: Secondary | ICD-10-CM | POA: Diagnosis present

## 2021-01-14 DIAGNOSIS — M222X1 Patellofemoral disorders, right knee: Secondary | ICD-10-CM | POA: Diagnosis present

## 2021-01-14 DIAGNOSIS — M6281 Muscle weakness (generalized): Secondary | ICD-10-CM | POA: Insufficient documentation

## 2021-01-14 NOTE — Therapy (Signed)
[place], [place], [address] Phone: [phone]   Fax:  [phone]  Physical Therapy Treatment  Patient Details  Name: MASIE BERMINGHAM MRN: [id number] Date of Birth: 30-Sep-1975 Referring Provider (PT): Melrose Nakayama, MD   Encounter Date: 01/14/2021   PT End of Session - 01/14/21 0827    Visit Number 3    Number of Visits 8    Date for PT Re-Evaluation 02/26/21    Authorization Type [place] MEDICAID HEALTHY BLUE    PT Start Time 0800    PT Stop Time 0844    PT Time Calculation (min) 44 min    Activity Tolerance Patient tolerated treatment well;No increased pain    Behavior During Therapy [facility] for tasks assessed/performed           History reviewed. No pertinent past medical history.  Past Surgical History:  Procedure Laterality Date  . BREAST BIOPSY Right 08/12/2018   benign  . BREAST BIOPSY Right 02/27/2019   benign    There were no vitals filed for this visit.   Subjective Assessment - 01/14/21 0802    Subjective Talayeh reports feeling a pulling sensation a few times in the back of the right leg with standing.  She has not other compliants.    Limitations Standing;Walking    Aggravating Factors  steps, squats, lunges, prlong walking.                             [place] Adult PT Treatment/Exercise - 01/14/21 0001      Knee/Hip Exercises: Stretches   Active Hamstring Stretch 20 seconds;3 reps;Both    Other Knee/Hip Stretches Gastroc stretch; 3x; 20 sec      Knee/Hip Exercises: Aerobic   Recumbent Bike 5 min level 3      Knee/Hip Exercises: Machines for Strengthening   Total Gym Leg Press 75 lb Both 3 x 10      Knee/Hip Exercises: Standing   Hip Abduction Stengthening;2 sets;15 reps    Abduction Limitations green loop    Wall Squat 15 reps    Wall Squat Limitations 1/4 depth with ball squeeze      Knee/Hip Exercises: Seated   Ball Squeeze with LAQ 10 x 2 Both       Knee/Hip Exercises: Supine   Bridges with Clamshell Strengthening;Both;15 reps;Other (comment)   Green Tband 5s hold                 PT Education - 01/14/21 0835    Education Details Advancement of HEP ([email or web address]), progression of HEP with reps    Person(s) Educated Patient    Methods Explanation;Handout    Comprehension Verbalized understanding            PT Short Term Goals - 01/11/21 0855      PT SHORT TERM GOAL #1   Status On-going      PT SHORT TERM GOAL #2   Status On-going             PT Long Term Goals - 01/07/21 1241      PT LONG TERM GOAL #1   Title Improve R hip strength to 4+ or greater strength for R knee pain reduction and to improve fucntional mobility    Baseline see flowsheets    Status New    Target Date 02/26/21      PT LONG TERM GOAL #2   Title With the functional  activities of dance, squats, asc/dsc steps pt will reports a decrease in R knee pain to 2 or less intermittently    Baseline 6/10    Status New    Target Date 02/26/21      PT LONG TERM GOAL #3   Title Pt will be Ind in a final HEP to maintain or progress pt's achieved level of function    Status New    Target Date 02/26/21                 Plan - 01/14/21 0836    Clinical Impression Statement Taina arrived to therapy with reports of pulling in the back of the right.  Educated her on the possibly of the bridge exercise activating the hamstring muscle.  Therapy was progressed today with wall squat, leg press, and standing hip abduction.  The patient's HEP was progressed with bridge, standing hip abduction, and wall squat.  The patient did report understanding to this.    Examination-Activity Limitations Squat;Stairs    Examination-Participation Restrictions Occupation;School    PT Treatment/Interventions ADLs/Self Care Home Management;Cryotherapy;Electrical Stimulation;Iontophoresis 4mg /ml Dexamethasone;Moist Heat;Ultrasound;Therapeutic exercise;Therapeutic activities;Stair  training;Functional mobility training;Gait training;Patient/family education;Manual techniques;Dry needling;Taping;Vasopneumatic Device;Joint Manipulations    PT Next Visit Plan Progress CKC strengthening,    PT Home Exercise Plan [email or web address]    Consulted and Agree with Plan of Care Patient           Patient will benefit from skilled therapeutic intervention in order to improve the following deficits and impairments:  Decreased activity tolerance,Pain,Decreased strength  Visit Diagnosis: Chondromalacia patellae of right knee  Muscle weakness (generalized)  Patellofemoral disorder of right knee  Localized edema     Problem List There are no problems to display for this patient.  Rich Number, PT, DPT, OCS, Crt. DN Bethena Midget 01/14/2021, 8:46 AM  [facility] [address] [place], [place], [address] Phone: [phone]   Fax:  [phone]  Name: AMALYA SALMONS MRN: [id number] Date of Birth: 19-Dec-1975

## 2021-01-14 NOTE — Patient Instructions (Addendum)
Access Code: [email or web address] URL: [email or web address] Date: 01/14/2021 Prepared by: Rich Number  Exercises Active Straight Leg Raise with Quad Set - 1 x daily - 7 x weekly - 3 sets - 10 reps - 2 hold Seated Hamstring Stretch - 1 x daily - 7 x weekly - 1 sets - 3 reps - 20 hold Gastroc Stretch on Wall - 1 x daily - 7 x weekly - 1 sets - 3 reps - 20 hold Seated Long Arc Quad - 1 x daily - 7 x weekly - 2 sets - 10 reps - 3 hold Hip Abduction with Resistance Loop - 1 x daily - 7 x weekly - 2 sets - 10 reps Supine Bridge with Mini Swiss Ball Between Knees - 1 x daily - 7 x weekly - 1 sets - 15 reps - 5 hold Wall Squat Hold with Ball - 1 x daily - 7 x weekly - 1 sets - 15 reps

## 2021-01-18 ENCOUNTER — Other Ambulatory Visit: Payer: Self-pay

## 2021-01-18 ENCOUNTER — Ambulatory Visit: Payer: Medicaid Other

## 2021-01-18 DIAGNOSIS — M2241 Chondromalacia patellae, right knee: Secondary | ICD-10-CM

## 2021-01-18 DIAGNOSIS — M222X1 Patellofemoral disorders, right knee: Secondary | ICD-10-CM

## 2021-01-18 DIAGNOSIS — M6281 Muscle weakness (generalized): Secondary | ICD-10-CM

## 2021-01-18 NOTE — Patient Instructions (Signed)
Access Code: [email or web address] URL: [email or web address] Date: 01/18/2021 Prepared by: Rich Number  Exercises Supine Quadriceps Stretch with Strap on Table - 1 x daily - 7 x weekly - 1 sets - 3 reps - 20 hold ITB Stretch at Wall - 1 x daily - 7 x weekly - 1 sets - 3 reps - 20 hold   DO AS NEEDED.

## 2021-01-18 NOTE — Therapy (Signed)
[place], [place], [address] Phone: [phone]   Fax:  [phone]  Physical Therapy Treatment  Patient Details  Name: ISOBELLE TUCKETT MRN: [id number] Date of Birth: [birth year] Referring Provider (PT): Melrose Nakayama, MD   Encounter Date: 01/18/2021   PT End of Session - 01/18/21 0804    Visit Number 4    Number of Visits 8    Date for PT Re-Evaluation 02/26/21    Authorization Type [place] MEDICAID HEALTHY BLUE    PT Start Time 0800    PT Stop Time 0842    PT Time Calculation (min) 42 min    Activity Tolerance Patient tolerated treatment well    Behavior During Therapy [facility] for tasks assessed/performed           No past medical history on file.  Past Surgical History:  Procedure Laterality Date  . BREAST BIOPSY Right 08/12/2018   benign  . BREAST BIOPSY Right 02/27/2019   benign    There were no vitals filed for this visit.   Subjective Assessment - 01/18/21 0802    Subjective Pang reports that she has been able to do the new exercises.  She has noted that her hips have been feeling tighter.  She has been doing her stretches more.    Limitations Standing;Walking    How long can you walk comfortably? 1 mile    Aggravating Factors  stairs, squats, lunges and prolong walking.                             [place] Adult PT Treatment/Exercise - 01/18/21 0001      Knee/Hip Exercises: Stretches   Active Hamstring Stretch 20 seconds;3 reps;Both    Active Hamstring Stretch Limitations sitting    Quad Stretch 20 seconds;Right;3 reps    Quad Stretch Limitations supine off edge of mat    ITB Stretch 20 seconds;3 reps;Right    ITB Stretch Limitations Standing      Knee/Hip Exercises: Aerobic   Recumbent Bike 5 min level 4      Knee/Hip Exercises: Machines for Strengthening   Total Gym Leg Press 75 lb Both 3 x 10      Knee/Hip Exercises: Standing   Hip Abduction Stengthening;2  sets;15 reps    Abduction Limitations green loop    Lateral Step Up 20 reps;Right    Lateral Step Up Limitations 4 inches, verbal cuing for knee alignment    Forward Step Up 20 reps;Right    Forward Step Up Limitations 4 inch step    Wall Squat 20 reps    Wall Squat Limitations 1/2 depth with ball      Knee/Hip Exercises: Supine   Bridges with Clamshell Strengthening;Both;Other (comment);20 reps   Green Tband 5s hold   Straight Leg Raises Right;10 reps;2 sets   2 sec   Straight Leg Raises Limitations 2# ankle weight; quad set prior to completing                  PT Education - 01/18/21 0832    Education Details continue with currently HEP, will progress HEP strengthening next session in therapy, stretches for as needed.    Person(s) Educated Patient    Methods Explanation    Comprehension Verbalized understanding            PT Short Term Goals - 01/18/21 0816      PT SHORT TERM GOAL #1  Status Achieved      PT SHORT TERM GOAL #2   Status Achieved             PT Long Term Goals - 01/18/21 0817      PT LONG TERM GOAL #1   Status On-going      PT LONG TERM GOAL #2   Status On-going      PT LONG TERM GOAL #3   Status On-going                 Plan - 01/18/21 0811    Clinical Impression Statement Cedricka is progressing in therapy as expected.  She is reporting improvement of walking tolerance to 1 mile versus .5 miles at her initial assessment.  She also reports less discomfort with the stairs.  She is avoiding deep knee bends currently.  Progressed therapy with 4 inch step ups, increase weight with the leg press, and added 2# with SLR.  Added hip stretches as the patient reported lateral knee and hip tightness with increase of activities.  Recommend continued therapy for squatting, stairs, and prolong walking/recreational activities.    Examination-Activity Limitations Squat;Stairs    Examination-Participation Restrictions Occupation;School    PT  Treatment/Interventions ADLs/Self Care Home Management;Cryotherapy;Electrical Stimulation;Iontophoresis 4mg /ml Dexamethasone;Moist Heat;Ultrasound;Therapeutic exercise;Therapeutic activities;Stair training;Functional mobility training;Gait training;Patient/family education;Manual techniques;Dry needling;Taping;Vasopneumatic Device;Joint Manipulations    PT Next Visit Plan Progress CKC strengthening, continue with hip stretches, progress to 6 inch steps if tolerated, progress HEP    PT Home Exercise Plan [email or web address]    Consulted and Agree with Plan of Care Patient           Patient will benefit from skilled therapeutic intervention in order to improve the following deficits and impairments:  Decreased activity tolerance,Pain,Decreased strength  Visit Diagnosis: Muscle weakness (generalized)  Chondromalacia patellae of right knee  Patellofemoral disorder of right knee     Problem List There are no problems to display for this patient.  Rich Number, PT, DPT, OCS, Crt. DN Bethena Midget 01/18/2021, 8:43 AM  [facility] [address] [place], [place], [address] Phone: [phone]   Fax:  [phone]  Name: GLADY OUDERKIRK MRN: [id number] Date of Birth: May 23, 1976

## 2021-01-20 ENCOUNTER — Other Ambulatory Visit: Payer: Self-pay

## 2021-01-20 ENCOUNTER — Ambulatory Visit: Payer: Medicaid Other

## 2021-01-20 DIAGNOSIS — M6281 Muscle weakness (generalized): Secondary | ICD-10-CM

## 2021-01-20 DIAGNOSIS — M222X1 Patellofemoral disorders, right knee: Secondary | ICD-10-CM

## 2021-01-20 DIAGNOSIS — M2241 Chondromalacia patellae, right knee: Secondary | ICD-10-CM

## 2021-01-20 NOTE — Therapy (Signed)
[place], [place], [address] Phone: [phone]   Fax:  [phone]  Physical Therapy Treatment  Patient Details  Name: Lindsay Wood MRN: [id number] Date of Birth: [birth year] Referring Provider (PT): Melrose Nakayama, MD   Encounter Date: 01/20/2021   PT End of Session - 01/20/21 0927     Visit Number 5    Number of Visits 8    Date for PT Re-Evaluation 02/26/21    Authorization Type [place] MEDICAID HEALTHY BLUE    PT Start Time 0849    PT Stop Time 0936    PT Time Calculation (min) 47 min    Activity Tolerance Patient tolerated treatment well;No increased pain    Behavior During Therapy [facility] for tasks assessed/performed             No past medical history on file.  Past Surgical History:  Procedure Laterality Date   BREAST BIOPSY Right 08/12/2018   benign   BREAST BIOPSY Right 02/27/2019   benign    There were no vitals filed for this visit.   Subjective Assessment - 01/20/21 0856     Subjective The patient reports that she was able to walk a mile without her right knee pain.  She did have some stiffness though.    Limitations Standing;Walking    How long can you walk comfortably? 1 mile    Patient Stated Goals To move the right knee without pain and full strength    Currently in Pain? No/denies                               [facility] Adult PT Treatment/Exercise - 01/20/21 0001       Knee/Hip Exercises: Stretches   Active Hamstring Stretch 20 seconds;3 reps;Both    Active Hamstring Stretch Limitations sitting    Quad Stretch 20 seconds;Right;3 reps    Quad Stretch Limitations supine off edge of mat    ITB Stretch 20 seconds;3 reps;Right    ITB Stretch Limitations Standing      Knee/Hip Exercises: Aerobic   Recumbent Bike 5 min level 4      Knee/Hip Exercises: Machines for Strengthening   Total Gym Leg Press 75 lb Both 3 x 10 ; Right 45 lb 2 x 10      Knee/Hip  Exercises: Standing   Hip Abduction Stengthening;4 sets;Knee straight    Abduction Limitations Side stepping 20"    Lateral Step Up 20 reps;Right    Lateral Step Up Limitations 6 inches    Forward Step Up 20 reps;Right    Forward Step Up Limitations 6 inches    Wall Squat 20 reps    Wall Squat Limitations 1/2 depth with ball      Knee/Hip Exercises: Seated   Ball Squeeze with LAQ 10 x 2 Both      Knee/Hip Exercises: Supine   Bridges with Clamshell Strengthening;Both;Other (comment);20 reps   Green Tband 5s hold   Straight Leg Raises Right;10 reps;2 sets   2 sec   Straight Leg Raises Limitations 2# ankle weight; quad set prior to completing                    PT Education - 01/20/21 0929     Education Details Advancement of HEP and slow progression of activity level    Person(s) Educated Patient    Methods Explanation;Handout    Comprehension Verbalized understanding  PT Short Term Goals - 01/18/21 0816       PT SHORT TERM GOAL #1   Status Achieved      PT SHORT TERM GOAL #2   Status Achieved               PT Long Term Goals - 01/20/21 0943       PT LONG TERM GOAL #1   Status On-going      PT LONG TERM GOAL #2   Status On-going      PT LONG TERM GOAL #3   Status On-going                   Plan - 01/20/21 0939     Clinical Impression Statement Lindsay Wood reports that she was able to walk for 1 mile yesterday without right knee pain.  She did have some stiffness though.  Progressed with 6 inch step ups forward and lateral.  The patient was able to perform with good form and no pain reports.  This was added to her HEP.  Conitnued with the leg press and added leg press unilaterally on the right.  The patient is progressing as expected in therapy.  Recommend continued therapy for prolong walking over a mile, squatting, stairs, and recreational activities.    Examination-Activity Limitations Squat;Stairs     Examination-Participation Restrictions Occupation;School    PT Treatment/Interventions ADLs/Self Care Home Management;Cryotherapy;Electrical Stimulation;Iontophoresis 4mg /ml Dexamethasone;Moist Heat;Ultrasound;Therapeutic exercise;Therapeutic activities;Stair training;Functional mobility training;Gait training;Patient/family education;Manual techniques;Dry needling;Taping;Vasopneumatic Device;Joint Manipulations    PT Next Visit Plan Progress CKC strengthening, continue with hip stretches, add SLS    PT Home Exercise Plan [email or web address]    Consulted and Agree with Plan of Care Patient             Patient will benefit from skilled therapeutic intervention in order to improve the following deficits and impairments:  Decreased activity tolerance, Pain, Decreased strength  Visit Diagnosis: Muscle weakness (generalized)  Chondromalacia patellae of right knee  Patellofemoral disorder of right knee     Problem List There are no problems to display for this patient.  Rich Number, PT, DPT, OCS, Crt. DN Bethena Midget 01/20/2021, 9:44 AM  [facility] [address] [place], [place], [address] Phone: [phone]   Fax:  [phone]  Name: Lindsay Wood MRN: [id number] Date of Birth: 07/11/1976

## 2021-01-20 NOTE — Patient Instructions (Signed)
Access Code: [email or web address] URL: [email or web address] Date: 01/20/2021 Prepared by: Rich Number  Exercises Active Straight Leg Raise with Quad Set - 1 x daily - 7 x weekly - 3 sets - 10 reps - 2 hold Seated Hamstring Stretch - 1 x daily - 7 x weekly - 1 sets - 3 reps - 20 hold Gastroc Stretch on Wall - 1 x daily - 7 x weekly - 1 sets - 3 reps - 20 hold Seated Long Arc Quad - 1 x daily - 7 x weekly - 2 sets - 10 reps - 3 hold Hip Abduction with Resistance Loop - 1 x daily - 7 x weekly - 2 sets - 10 reps Supine Bridge with Mini Swiss Ball Between Knees - 1 x daily - 7 x weekly - 1 sets - 15 reps - 5 hold Wall Squat Hold with Ball - 1 x daily - 7 x weekly - 1 sets - 15 reps Lateral Step Down - 1 x daily - 7 x weekly - 2 sets - 10 reps Step Downs - 1 x daily - 7 x weekly - 2 sets - 10 reps

## 2021-01-25 ENCOUNTER — Other Ambulatory Visit: Payer: Self-pay

## 2021-01-25 ENCOUNTER — Ambulatory Visit: Payer: Medicaid Other

## 2021-01-25 DIAGNOSIS — M6281 Muscle weakness (generalized): Secondary | ICD-10-CM

## 2021-01-25 DIAGNOSIS — M222X1 Patellofemoral disorders, right knee: Secondary | ICD-10-CM

## 2021-01-25 DIAGNOSIS — M2241 Chondromalacia patellae, right knee: Secondary | ICD-10-CM

## 2021-01-25 NOTE — Therapy (Signed)
[place], [place], [address] Phone: [phone]   Fax:  [phone]  Physical Therapy Treatment  Patient Details  Name: Lindsay Wood MRN: [id number] Date of Birth: 10-16-1975 Referring Provider (PT): Melrose Nakayama, MD   Encounter Date: 01/25/2021   PT End of Session - 01/25/21 0810     Visit Number 6    Number of Visits 8    Date for PT Re-Evaluation 02/26/21    Authorization Type [place] MEDICAID HEALTHY BLUE    PT Start Time [phone]   pt arrived 6 minutes late for therapy   PT Stop Time 0846    PT Time Calculation (min) 40 min    Activity Tolerance Patient tolerated treatment well;No increased pain    Behavior During Therapy [facility] for tasks assessed/performed             No past medical history on file.  Past Surgical History:  Procedure Laterality Date   BREAST BIOPSY Right 08/12/2018   benign   BREAST BIOPSY Right 02/27/2019   benign    There were no vitals filed for this visit.   Subjective Assessment - 01/25/21 0809     Subjective The patient reports that she still gets aches in the knee, but it is not like it was.    Limitations Standing;Walking    Patient Stated Goals To move the right knee without pain and full strength    Currently in Pain? No/denies                               [facility] Adult PT Treatment/Exercise - 01/25/21 0001       Knee/Hip Exercises: Stretches   Sports administrator 20 seconds;Right;3 reps    Sports administrator Limitations supine off edge of mat    ITB Stretch 20 seconds;3 reps;Right    ITB Stretch Limitations Standing      Knee/Hip Exercises: Aerobic   Recumbent Bike 5 min level 5      Knee/Hip Exercises: Machines for Strengthening   Total Gym Leg Press 85 lb Both 3 x 10 ; Right 45 lb 2 x 10      Knee/Hip Exercises: Standing   Hip Abduction Stengthening;4 sets;Knee straight    Abduction Limitations Side stepping 20";   green loop   Lateral  Step Up 20 reps;Right    Lateral Step Up Limitations 6 inches    Forward Step Up 20 reps;Right    Forward Step Up Limitations 8 inches    Step Down Right;20 reps    Step Down Limitations 4 inches    Wall Squat 20 reps    Wall Squat Limitations 1/2 depth with ball    SLS On BOSU Ball 10" x 4 both      Manual Therapy   Manual Therapy Joint mobilization    Joint Mobilization patella all directions grade II                    PT Education - 01/25/21 0844     Education Details Ice after therapy for soreness    Person(s) Educated Patient    Methods Explanation    Comprehension Verbalized understanding              PT Short Term Goals - 01/18/21 0816       PT SHORT TERM GOAL #1   Status Achieved      PT SHORT TERM  GOAL #2   Status Achieved               PT Long Term Goals - 01/20/21 0943       PT LONG TERM GOAL #1   Status On-going      PT LONG TERM GOAL #2   Status On-going      PT LONG TERM GOAL #3   Status On-going                   Plan - 01/25/21 0835     Clinical Impression Statement Lindsay Wood reports that her knee is improving.  She denies pain coming into therapy today.  Progressed step up height to 8 inches and added 4 inch step downs.  She tolerated both of these without incresae of right knee pain.  Attempted 8 inch lateral step ups, however the patient did have reports of right knee achiness.  Reduced the height to 6 inches.  Progressed balance training with SLS on the BOSU ball for dynamic hip/knee control.  Recommend continued therapy for improvement of knee strength for functonal activities of stairs and deep squatting.    Examination-Activity Limitations Squat;Stairs    Examination-Participation Restrictions Occupation;School    PT Treatment/Interventions ADLs/Self Care Home Management;Cryotherapy;Electrical Stimulation;Iontophoresis 4mg /ml Dexamethasone;Moist Heat;Ultrasound;Therapeutic exercise;Therapeutic activities;Stair  training;Functional mobility training;Gait training;Patient/family education;Manual techniques;Dry needling;Taping;Vasopneumatic Device;Joint Manipulations    PT Next Visit Plan Progress CKC strengthening, continue with hip stretches, 6 inch step downs,    PT Home Exercise Plan [email or web address]    Consulted and Agree with Plan of Care Patient             Patient will benefit from skilled therapeutic intervention in order to improve the following deficits and impairments:  Decreased activity tolerance, Pain, Decreased strength  Visit Diagnosis: Chondromalacia patellae of right knee  Muscle weakness (generalized)  Patellofemoral disorder of right knee     Problem List There are no problems to display for this patient.  Rich Number, PT, DPT, OCS, Crt. DN  Bethena Midget 01/25/2021, 8:46 AM  [facility] [address] [place], [place], [address] Phone: [phone]   Fax:  [phone]  Name: Lindsay Wood MRN: [id number] Date of Birth: February 26, 1976

## 2021-02-01 ENCOUNTER — Ambulatory Visit: Payer: Medicaid Other

## 2021-02-01 ENCOUNTER — Other Ambulatory Visit: Payer: Self-pay

## 2021-02-01 DIAGNOSIS — R600 Localized edema: Secondary | ICD-10-CM

## 2021-02-01 DIAGNOSIS — M25561 Pain in right knee: Secondary | ICD-10-CM

## 2021-02-01 DIAGNOSIS — R262 Difficulty in walking, not elsewhere classified: Secondary | ICD-10-CM

## 2021-02-01 DIAGNOSIS — M2241 Chondromalacia patellae, right knee: Secondary | ICD-10-CM | POA: Diagnosis not present

## 2021-02-01 DIAGNOSIS — M25562 Pain in left knee: Secondary | ICD-10-CM

## 2021-02-01 DIAGNOSIS — G8929 Other chronic pain: Secondary | ICD-10-CM

## 2021-02-01 NOTE — Therapy (Signed)
[place], [place], [address] Phone: [phone]   Fax:  [phone]  Physical Therapy Treatment  Patient Details  Name: SHIRLETTE SCARBER MRN: [id number] Date of Birth: Jun 24, 1976 Referring Provider (PT): Melrose Nakayama, MD   Encounter Date: 02/01/2021   PT End of Session - 02/01/21 1721     Visit Number 7    Number of Visits 8    Date for PT Re-Evaluation 02/26/21    Authorization Type [place] MEDICAID HEALTHY BLUE    PT Start Time 1508    PT Stop Time 1552    PT Time Calculation (min) 44 min    Activity Tolerance Patient tolerated treatment well;No increased pain    Behavior During Therapy [facility] for tasks assessed/performed             History reviewed. No pertinent past medical history.  Past Surgical History:  Procedure Laterality Date   BREAST BIOPSY Right 08/12/2018   benign   BREAST BIOPSY Right 02/27/2019   benign    There were no vitals filed for this visit.   Subjective Assessment - 02/01/21 1519     Subjective Pt reports participating in her dance course work and was able to do so with just aching of the R knee and only a small degree of modification with her steps.    Limitations Standing;Walking    How long can you stand comfortably? not an issue    How long can you walk comfortably? 1 mile    Diagnostic tests By Dr. Rhona Raider, pt reports  it showed inflamation behind the knee cap    Patient Stated Goals To move the right knee without pain and full strength    Currently in Pain? Yes    Pain Score 3     Pain Location Knee    Pain Orientation Right    Pain Descriptors / Indicators Aching;Sharp    Pain Type Chronic pain    Pain Onset More than a month ago    Pain Frequency Intermittent                               [facility] Adult PT Treatment/Exercise - 02/01/21 0001       Knee/Hip Exercises: Stretches   Sports administrator Right;Left;2 reps;20 seconds    Quad  Stretch Limitations supine off edge of mat    ITB Stretch Right;Left;2 reps;20 seconds    ITB Stretch Limitations Standing      Knee/Hip Exercises: Aerobic   Recumbent Bike 5 min level 5      Knee/Hip Exercises: Machines for Strengthening   Total Gym Leg Press 85 lb Both 3 x 10 ; Right and Left 45 lb 2 x 10      Knee/Hip Exercises: Standing   Hip Abduction Stengthening;4 sets;Knee straight    Abduction Limitations Side stepping 20";   green loop   Lateral Step Up Right;Left;2 sets;10 reps    Lateral Step Up Limitations 6 inches    Forward Step Up Right;Left;2 sets;10 reps    Forward Step Up Limitations 8 inches    Step Down Right;Left;2 sets;10 reps                      PT Short Term Goals - 01/18/21 0816       PT SHORT TERM GOAL #1   Status Achieved      PT SHORT TERM GOAL #  2   Status Achieved               PT Long Term Goals - 01/20/21 0943       PT LONG TERM GOAL #1   Status On-going      PT LONG TERM GOAL #2   Status On-going      PT LONG TERM GOAL #3   Status On-going                   Plan - 02/01/21 1722     Clinical Impression Statement Pt reports R knee achiness following 2 days of dance for her school course work. While having low soreness, pt report being please with how she is able to dance with a low pain level and and good qaulity. Pt tolerated today session without increase in the R knee pain. Pt opted to apply a cold pack at home vs. at PT.    Examination-Activity Limitations Squat;Stairs    Examination-Participation Restrictions Occupation;School    Stability/Clinical Decision Making Stable/Uncomplicated    Clinical Decision Making Low    Rehab Potential Excellent    PT Frequency 2x / week    PT Duration 2 weeks    PT Treatment/Interventions ADLs/Self Care Home Management;Cryotherapy;Electrical Stimulation;Iontophoresis 4mg /ml Dexamethasone;Moist Heat;Ultrasound;Therapeutic exercise;Therapeutic activities;Stair  training;Functional mobility training;Gait training;Patient/family education;Manual techniques;Dry needling;Taping;Vasopneumatic Device;Joint Manipulations    PT Next Visit Plan Assess pt's progress for continuation of PT or DC from PT services. Progress CKC strengthening, continue with hip stretches, 6 inch step downs,    PT Home Exercise Plan [id number]    Consulted and Agree with Plan of Care Patient             Patient will benefit from skilled therapeutic intervention in order to improve the following deficits and impairments:  Decreased activity tolerance, Pain, Decreased strength  Visit Diagnosis: Chronic pain of left knee  Chronic pain of right knee  Localized edema  Difficulty in walking, not elsewhere classified     Problem List There are no problems to display for this patient.   Gar Ponto 02/01/2021, 5:34 PM  [facility] [address] [place], [place], [address] Phone: [phone]   Fax:  [phone]  Name: NITZIA PERREN MRN: [id number] Date of Birth: [birth year]

## 2021-02-08 ENCOUNTER — Ambulatory Visit: Payer: Medicaid Other

## 2021-02-08 ENCOUNTER — Other Ambulatory Visit: Payer: Self-pay

## 2021-02-08 DIAGNOSIS — G8929 Other chronic pain: Secondary | ICD-10-CM

## 2021-02-08 DIAGNOSIS — M25561 Pain in right knee: Secondary | ICD-10-CM

## 2021-02-08 DIAGNOSIS — M2241 Chondromalacia patellae, right knee: Secondary | ICD-10-CM

## 2021-02-08 DIAGNOSIS — M222X1 Patellofemoral disorders, right knee: Secondary | ICD-10-CM

## 2021-02-08 NOTE — Therapy (Addendum)
[place], [place], [address] Phone: [phone]   Fax:  [phone]  Physical Therapy Treatment/ Discharge Note  Patient Details  Name: HILARY MILKS MRN: [id number] Date of Birth: [birth year] Referring Provider (PT): Melrose Nakayama, MD   Encounter Date: 02/08/2021   PT End of Session - 02/08/21 0802     Visit Number 8    Number of Visits 12    Date for PT Re-Evaluation 02/26/21    Authorization Type [place] MEDICAID HEALTHY BLUE    PT Start Time 0802    PT Stop Time 0843    PT Time Calculation (min) 41 min    Activity Tolerance Patient tolerated treatment well    Behavior During Therapy [facility] for tasks assessed/performed             No past medical history on file.  Past Surgical History:  Procedure Laterality Date   BREAST BIOPSY Right 08/12/2018   benign   BREAST BIOPSY Right 02/27/2019   benign    There were no vitals filed for this visit.   Subjective Assessment - 02/08/21 0804     Subjective The patient reports that she had a full dance week.  She did contemporary intensive dance.  She is preparing to teach dance.  She reports that her knee did not lock up on her.  She was able to do all the moves.  She did ice the knee afterwards.    Limitations Walking;Other (comment)   recreational activities / dancing   How long can you walk comfortably? 1 mile    Currently in Pain? No/denies                [facility] PT Assessment - 02/08/21 0001       Assessment   Medical Diagnosis R Knee chondromalacia patella    Referring Provider (PT) Melrose Nakayama, MD      Functional Tests   Functional tests Single Leg Squat      Squat   Comments 3/4 depth      Step Up   Comments 8 inch x 15 Able to perform bilaterally 1/10 pain R      Step Down   Comments 8 inch x 15 Able to perform bilaterally 3/10 R knee pain      Single Leg Squat   Comments 1/3 depth bilaterally 3/10 R knee pain       Strength   Right Hip Flexion 4+/5    Right Hip Extension 4+/5    Right Hip External Rotation  5/5    Right Hip Internal Rotation 5/5    Right Hip ABduction 4+/5    Right Hip ADduction 4+/5    Left Hip Flexion 5/5    Left Hip Extension 5/5    Left Hip External Rotation 5/5    Left Hip Internal Rotation 5/5    Left Hip ABduction 5/5    Left Hip ADduction 5/5    Right Knee Flexion 5/5    Right Knee Extension 5/5    Left Knee Flexion 5/5    Left Knee Extension 5/5      Patellofemoral Apprehension Test    Findings Negative    Side  Right      Patellofemoral Grind test (Clark's Sign)   Findings Postive    Side  Right                           [facility]  Adult PT Treatment/Exercise - 02/08/21 0001       Knee/Hip Exercises: Machines for Strengthening   Total Gym Leg Press 80 lb Both 3 x 10 ; Right and Left 45 lb 2 x 10      Knee/Hip Exercises: Standing   Hip Abduction Stengthening;4 sets;Knee straight    Abduction Limitations Side stepping 20";   green loop     Knee/Hip Exercises: Supine   Single Leg Bridge Right;Left;15 reps      Manual Therapy   Manual Therapy Joint mobilization    Joint Mobilization patella all directions grade II                      PT Short Term Goals - 02/08/21 0829       PT SHORT TERM GOAL #1   Status Achieved      PT SHORT TERM GOAL #2   Status Achieved               PT Long Term Goals - 02/08/21 0829       PT LONG TERM GOAL #1   Title Improve R hip strength to 4+ or greater strength for R knee pain reduction and to improve fucntional mobility    Baseline see flowsheets    Status Achieved      PT LONG TERM GOAL #2   Title With the functional activities of dance, squats, asc/dsc steps pt will reports a decrease in R knee pain to 2 or less intermittently    Baseline 6/10    Status Partially Met    Target Date 03/08/21      PT LONG TERM GOAL #3   Title Pt will be Ind in a final HEP to maintain or  progress pt's achieved level of function    Status On-going                   Plan - 02/08/21 0830     Clinical Impression Statement Lina has attended 8 visits of physical therapy for right knee pain.  She is making good progress in physical therapy.  She has met 2 out of 3 long term goals.  She has 3/10 pain with stairs and deep squatting to 3/4 depth.  The patient was able to return to dancing training with knee pain aggravation to 3/10.  She presents with improvement of bilateral hip strength with manual muscle testing.  The patient will follow up with the referring MD tomorrow.  Recommend conitnued therapy per the MD orders to continue reduction of knee pain with activities for 4 more weeks.    Examination-Activity Limitations Squat;Stairs    Examination-Participation Restrictions Occupation;School    Stability/Clinical Decision Making Stable/Uncomplicated    Rehab Potential Excellent    PT Frequency 1x / week    PT Duration 4 weeks    PT Treatment/Interventions ADLs/Self Care Home Management;Cryotherapy;Electrical Stimulation;Iontophoresis 66m/ml Dexamethasone;Moist Heat;Ultrasound;Therapeutic exercise;Therapeutic activities;Stair training;Functional mobility training;Gait training;Patient/family education;Manual techniques;Dry needling;Taping;Vasopneumatic Device;Joint Manipulations    PT Next Visit Plan . Progress CKC strengthening, continue with hip stretches, 6 inch step downs,    PT Home Exercise Plan [id number]    Consulted and Agree with Plan of Care Patient            Discharge from therapy as the patient has not returned since her MD follow-up.    Patient will benefit from skilled therapeutic intervention in order to improve the following deficits and impairments:  Decreased activity tolerance, Pain, Decreased strength  Visit Diagnosis: Chronic pain of right knee  Chondromalacia patellae of right knee  Patellofemoral disorder of right knee     Problem  List There are no problems to display for this patient.  Rich Number, PT, DPT, OCS, Crt. DN  Bethena Midget 02/08/2021, 8:42 AM  [facility] [address] [place], [place], [address] Phone: [phone]   Fax:  [phone]  Name: DAVA RENSCH MRN: [id number] Date of Birth: [birth year]

## 2021-02-15 ENCOUNTER — Ambulatory Visit: Payer: Medicaid Other

## 2021-11-23 ENCOUNTER — Telehealth: Payer: Self-pay | Admitting: Internal Medicine

## 2021-11-23 NOTE — Telephone Encounter (Signed)
Scheduled appt per 4/12 referral. Pt is aware of appt date and time. Pt is aware to arrive 15 mins prior to appt time and to bring and updated insurance card. Pt is aware of appt location.   ?

## 2021-12-09 ENCOUNTER — Other Ambulatory Visit: Payer: Self-pay | Admitting: Medical Oncology

## 2021-12-09 DIAGNOSIS — C3490 Malignant neoplasm of unspecified part of unspecified bronchus or lung: Secondary | ICD-10-CM

## 2021-12-12 ENCOUNTER — Inpatient Hospital Stay: Payer: Medicaid Other | Attending: Internal Medicine | Admitting: Internal Medicine

## 2021-12-12 ENCOUNTER — Other Ambulatory Visit: Payer: Self-pay

## 2021-12-12 ENCOUNTER — Inpatient Hospital Stay: Payer: Medicaid Other

## 2021-12-12 ENCOUNTER — Other Ambulatory Visit: Payer: Self-pay | Admitting: Internal Medicine

## 2021-12-12 ENCOUNTER — Encounter: Payer: Self-pay | Admitting: Internal Medicine

## 2021-12-12 VITALS — BP 122/56 | HR 79 | Temp 98.4°F | Resp 18 | Wt 141.0 lb

## 2021-12-12 DIAGNOSIS — D500 Iron deficiency anemia secondary to blood loss (chronic): Secondary | ICD-10-CM

## 2021-12-12 DIAGNOSIS — D573 Sickle-cell trait: Secondary | ICD-10-CM | POA: Diagnosis not present

## 2021-12-12 DIAGNOSIS — Z800 Family history of malignant neoplasm of digestive organs: Secondary | ICD-10-CM | POA: Insufficient documentation

## 2021-12-12 DIAGNOSIS — D509 Iron deficiency anemia, unspecified: Secondary | ICD-10-CM | POA: Insufficient documentation

## 2021-12-12 DIAGNOSIS — C3490 Malignant neoplasm of unspecified part of unspecified bronchus or lung: Secondary | ICD-10-CM

## 2021-12-12 DIAGNOSIS — D539 Nutritional anemia, unspecified: Secondary | ICD-10-CM

## 2021-12-12 DIAGNOSIS — D72819 Decreased white blood cell count, unspecified: Secondary | ICD-10-CM | POA: Diagnosis not present

## 2021-12-12 LAB — CMP (CANCER CENTER ONLY)
ALT: 9 U/L (ref 0–44)
AST: 16 U/L (ref 15–41)
Albumin: 4.2 g/dL (ref 3.5–5.0)
Alkaline Phosphatase: 40 U/L (ref 38–126)
Anion gap: 3 — ABNORMAL LOW (ref 5–15)
BUN: 11 mg/dL (ref 6–20)
CO2: 31 mmol/L (ref 22–32)
Calcium: 9.3 mg/dL (ref 8.9–10.3)
Chloride: 103 mmol/L (ref 98–111)
Creatinine: 0.85 mg/dL (ref 0.44–1.00)
GFR, Estimated: >60 mL/min (ref >60)
Glucose, Bld: 98 mg/dL (ref 70–99)
Potassium: 3.5 mmol/L (ref 3.5–5.1)
Sodium: 137 mmol/L (ref 135–145)
Total Bilirubin: 0.4 mg/dL (ref 0.3–1.2)
Total Protein: 7.5 g/dL (ref 6.5–8.1)

## 2021-12-12 LAB — CBC WITH DIFFERENTIAL (CANCER CENTER ONLY)
Abs Immature Granulocytes: 0.01 10*3/uL (ref 0.00–0.07)
Basophils Absolute: 0.1 10*3/uL (ref 0.0–0.1)
Basophils Relative: 2 %
Eosinophils Absolute: 0.1 10*3/uL (ref 0.0–0.5)
Eosinophils Relative: 3 %
HCT: 31.6 % — ABNORMAL LOW (ref 36.0–46.0)
Hemoglobin: 9.7 g/dL — ABNORMAL LOW (ref 12.0–15.0)
Immature Granulocytes: 0 %
Lymphocytes Relative: 32 %
Lymphs Abs: 1.2 10*3/uL (ref 0.7–4.0)
MCH: 24.4 pg — ABNORMAL LOW (ref 26.0–34.0)
MCHC: 30.7 g/dL (ref 30.0–36.0)
MCV: 79.6 fL — ABNORMAL LOW (ref 80.0–100.0)
Monocytes Absolute: 0.3 10*3/uL (ref 0.1–1.0)
Monocytes Relative: 8 %
Neutro Abs: 2.1 10*3/uL (ref 1.7–7.7)
Neutrophils Relative %: 55 %
Platelet Count: 359 10*3/uL (ref 150–400)
RBC: 3.97 MIL/uL (ref 3.87–5.11)
RDW: 16.7 % — ABNORMAL HIGH (ref 11.5–15.5)
WBC Count: 3.8 10*3/uL — ABNORMAL LOW (ref 4.0–10.5)
nRBC: 0 % (ref 0.0–0.2)

## 2021-12-12 LAB — TSH: TSH: 2.974 u[IU]/mL (ref 0.350–4.500)

## 2021-12-12 LAB — IRON AND IRON BINDING CAPACITY (CC-WL,HP ONLY)
Iron: 31 ug/dL (ref 28–170)
Saturation Ratios: 7 % — ABNORMAL LOW (ref 10.4–31.8)
TIBC: 433 ug/dL (ref 250–450)
UIBC: 402 ug/dL (ref 148–442)

## 2021-12-12 LAB — FERRITIN: Ferritin: 10 ng/mL — ABNORMAL LOW (ref 11–307)

## 2021-12-12 LAB — VITAMIN B12: Vitamin B-12: 635 pg/mL (ref 180–914)

## 2021-12-12 LAB — FOLATE: Folate: 15.9 ng/mL (ref >5.9)

## 2021-12-12 NOTE — Progress Notes (Signed)
? ? [place] ?[phone]   [phone] ? ?CONSULT NOTE ? ?REFERRING PHYSICIAN: Almedia Balls, NP ? ?REASON FOR CONSULTATION:  ?46 years old African-American female with persistent anemia ? ?HPI ?Lindsay Wood is a 46 y.o. female with past medical history significant for dyslipidemia, benign right breast biopsies, sickle cell trait as well as acid reflux.  The patient was seen recently by her primary care provider for routine evaluation and CBC on 11/22/2021 showed low total white blood count of 3.4 with also anemia and hemoglobin of 10.2 and hematocrit 31.2%.  Her platelets count were normal at 3 74,000.  Iron study at that time showed serum iron of 22 with iron saturation of 5% and TIBC of 485.  The patient has been taking over-the-counter iron supplement with ferrous sulfate 65 mg p.o. daily but she is not compliant with the iron because of constipation and other gastrointestinal issues.  She has been taking it for years with no improvement in her anemia.  She was referred to me today for evaluation and recommendation regarding her persistent anemia. ?The patient mentions that she has menorrhagia with menstrual period every 20 days that are heavy with blood clots.  She had colonoscopy by [facility] gastroenterology in May 2022 that was negative.  ?When seen today the patient continues to complain of mild fatigue but no significant dizzy spells or shortness of breath.  She has intermittent chest pain but no underlying cardiac etiology.  The patient denied having any nausea, vomiting, diarrhea or abdominal pain.  She denied having any headache or visual changes.  She has no fever or chills. ?Family history significant for mother with colon cancer and died at age 32.  Her mother also had sickle cell trait.  Father had hypertension. ?The patient is married and has 4 children.  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  She has no history of smoking, alcohol or  drug abuse. ?HPI ? ?Past Medical History:  ?Diagnosis Date  ? Acid reflux   ? Dyslipidemia   ? Sickle cell trait ([place])   ? ? ?Past Surgical History:  ?Procedure Laterality Date  ? BREAST BIOPSY Right 08/12/2018  ? benign  ? BREAST BIOPSY Right 02/27/2019  ? benign  ? Right breast biopsy    ? ? ?Family History  ?Problem Relation Age of Onset  ? Colon cancer Mother   ? Sickle cell trait Mother   ? Hypertension Father   ? ? ?Social History ?Social History  ? ?Tobacco Use  ? Smoking status: Former  ? Smokeless tobacco: Never  ?Substance Use Topics  ? Alcohol use: Never  ? Drug use: Never  ? ? ?No Known Allergies ? ?No current outpatient medications on file.  ? ?No current facility-administered medications for this visit.  ? ? ?Review of Systems ? ?Constitutional: positive for fatigue ?Eyes: negative ?Ears, nose, mouth, throat, and face: negative ?Respiratory: negative ?Cardiovascular: negative ?Gastrointestinal: positive for constipation ?Genitourinary:negative ?Integument/breast: negative ?Hematologic/lymphatic: negative ?Musculoskeletal:negative ?Neurological: negative ?Behavioral/Psych: negative ?Endocrine: negative ?Allergic/Immunologic: negative ? ?Physical Exam ? ?POE:UMPNT, healthy, no distress, well nourished, and well developed ?SKIN: skin color, texture, turgor are normal, no rashes or significant lesions ?HEAD: Normocephalic, No masses, lesions, tenderness or abnormalities ?EYES: normal, PERRLA, Conjunctiva are pink and non-injected ?EARS: External ears normal, Canals clear ?OROPHARYNX:no exudate, no erythema, and lips, buccal mucosa, and tongue normal  ?NECK: supple, no adenopathy, no JVD ?LYMPH:  no palpable lymphadenopathy, no hepatosplenomegaly ?BREAST:not examined ?LUNGS: clear  to auscultation , and palpation ?HEART: regular rate & rhythm, no murmurs, and no gallops ?ABDOMEN:abdomen soft, non-tender, normal bowel sounds, and no masses or organomegaly ?BACK: Back symmetric, no curvature., No CVA  tenderness ?EXTREMITIES:no joint deformities, effusion, or inflammation, no edema  ?NEURO: alert & oriented x 3 with fluent speech, no focal motor/sensory deficits ? ?PERFORMANCE STATUS: ECOG 1 ? ?LABORATORY DATA: ?Lab Results  ?Component Value Date  ? WBC 3.8 (L) 12/12/2021  ? HGB 9.7 (L) 12/12/2021  ? HCT 31.6 (L) 12/12/2021  ? MCV 79.6 (L) 12/12/2021  ? PLT 359 12/12/2021  ? ? ?  Chemistry   ?   ?Component Value Date/Time  ? NA 137 12/12/2021 1147  ? K 3.5 12/12/2021 1147  ? CL 103 12/12/2021 1147  ? CO2 31 12/12/2021 1147  ? BUN 11 12/12/2021 1147  ? CREATININE 0.85 12/12/2021 1147  ?    ?Component Value Date/Time  ? CALCIUM 9.3 12/12/2021 1147  ? ALKPHOS 40 12/12/2021 1147  ? AST 16 12/12/2021 1147  ? ALT 9 12/12/2021 1147  ? BILITOT 0.4 12/12/2021 1147  ?  ? ? ? ?RADIOGRAPHIC STUDIES: ?No results found. ? ?ASSESSMENT: This is a very pleasant 46 years old African-American female with history of sickle cell trait as well as iron deficiency anemia that has been going on for several years with no improvement in the oral iron tablets. ? ? ?PLAN: I had a lengthy discussion with the patient today about her current condition and treatment options. ?I ordered several studies today including repeat CBC that showed hemoglobin of 9.7 and hematocrit 31.6% with MCV of 79.6.  She also has leukocytopenia with total white blood count of 3.8 and normal platelets count of 3 59,000.  Her iron studies showed serum iron of 31 with iron saturation of 7% and TIBC of 433.  Ferritin level was 10.  The patient has normal serum folate and vitamin B12 level.  She also has a TSH that is normal at 2.974.  Serum protein electrophoresis with immunofixation is still pending. ?I recommended for the patient to proceed with iron infusion with Venofer 300 mg IV weekly for 3 weeks at the [facility]. ?I will see the patient back for follow-up visit in 3 months for evaluation with repeat CBC, iron study and ferritin. ?The patient was  also advised to take her oral iron tablet with vitamin C or orange juice and to avoid black tea at the time of her meals. ?The patient is in agreement with the current plan. ?She was advised to call immediately if she has any other concerning symptoms in the interval. ? ?The patient voices understanding of current disease status and treatment options and is in agreement with the current care plan. ? ?All questions were answered. The patient knows to call the clinic with any problems, questions or concerns. We can certainly see the patient much sooner if necessary. ? ?Thank you so much for allowing me to participate in the care of Merilyn Baba. I will continue to follow up the patient with you and assist in her care. ? ?The total time spent in the appointment was 60 minutes. ? ?Disclaimer: This note was dictated with voice recognition software. Similar sounding words can inadvertently be transcribed and may not be corrected upon review. ? ? ?Eilleen Kempf ?Dec 12, 2021, 11:35 AM ? ? ? ?

## 2021-12-13 ENCOUNTER — Telehealth: Payer: Self-pay | Admitting: Pharmacy Technician

## 2021-12-13 NOTE — Telephone Encounter (Addendum)
Dr. Julien Nordmann, Juluis Rainier note:  Auth Submission: NO AUTH NEEDED Payer: [place] MEDICAID Medication & CPT/J Code(s) submitted: Venofer (Iron Sucrose) J1756 Route of submission (phone, fax, portal): PHONE Auth type: Buy/Bill Units/visits requested: 3 doses Reference number:  Approval from: 12/13/21 to 08/13/22   Patient will be scheduled as soon as possible.

## 2021-12-14 LAB — PROTEIN ELECTROPHORESIS, SERUM, WITH REFLEX
A/G Ratio: 1.3 (ref 0.7–1.7)
Albumin ELP: 3.9 g/dL (ref 2.9–4.4)
Alpha-1-Globulin: 0.2 g/dL (ref 0.0–0.4)
Alpha-2-Globulin: 0.6 g/dL (ref 0.4–1.0)
Beta Globulin: 0.8 g/dL (ref 0.7–1.3)
Gamma Globulin: 1.5 g/dL (ref 0.4–1.8)
Globulin, Total: 3.1 g/dL (ref 2.2–3.9)
Total Protein ELP: 7 g/dL (ref 6.0–8.5)

## 2022-01-02 ENCOUNTER — Ambulatory Visit (INDEPENDENT_AMBULATORY_CARE_PROVIDER_SITE_OTHER): Payer: Medicaid Other

## 2022-01-02 VITALS — BP 129/83 | HR 74 | Temp 97.9°F | Resp 16 | Ht 63.0 in | Wt 136.6 lb

## 2022-01-02 DIAGNOSIS — D509 Iron deficiency anemia, unspecified: Secondary | ICD-10-CM | POA: Diagnosis not present

## 2022-01-02 DIAGNOSIS — D500 Iron deficiency anemia secondary to blood loss (chronic): Secondary | ICD-10-CM

## 2022-01-02 MED ORDER — SODIUM CHLORIDE 0.9 % IV SOLN
300.0000 mg | INTRAVENOUS | Status: DC
  Administered 2022-01-02: 300 mg via INTRAVENOUS
  Filled 2022-01-02: qty 15

## 2022-01-02 NOTE — Patient Instructions (Signed)

## 2022-01-02 NOTE — Progress Notes (Signed)
Diagnosis: Iron Deficiency Anemia  Provider:  Marshell Garfinkel, MD  Procedure: Infusion  IV Type: Peripheral, IV Location: L Forearm  Venofer (Iron Sucrose), Dose: 300 mg  Infusion Start Time: 7897  Infusion Stop Time: 8478  Post Infusion IV Care: Observation period completed and Peripheral IV Discontinued  Discharge: Condition: Good, Destination: Home . AVS provided to patient.   Performed by:  Cleophus Molt, RN

## 2022-01-04 ENCOUNTER — Ambulatory Visit: Payer: Medicaid Other

## 2022-01-06 ENCOUNTER — Ambulatory Visit: Payer: Medicaid Other

## 2022-01-10 ENCOUNTER — Ambulatory Visit (INDEPENDENT_AMBULATORY_CARE_PROVIDER_SITE_OTHER): Payer: Medicaid Other

## 2022-01-10 VITALS — BP 119/76 | HR 73 | Temp 98.8°F | Resp 18 | Ht 63.0 in | Wt 143.2 lb

## 2022-01-10 DIAGNOSIS — D509 Iron deficiency anemia, unspecified: Secondary | ICD-10-CM

## 2022-01-10 DIAGNOSIS — D500 Iron deficiency anemia secondary to blood loss (chronic): Secondary | ICD-10-CM

## 2022-01-10 MED ORDER — SODIUM CHLORIDE 0.9 % IV SOLN
300.0000 mg | INTRAVENOUS | Status: DC
  Administered 2022-01-10: 300 mg via INTRAVENOUS
  Filled 2022-01-10: qty 15

## 2022-01-10 NOTE — Progress Notes (Signed)
Diagnosis: Iron Deficiency Anemia  Provider:  Marshell Garfinkel, MD  Procedure: Infusion  IV Type: Peripheral, IV Location: L Forearm  Venofer (Iron Sucrose), Dose: 300 mg  Infusion Start Time: 1011  Infusion Stop Time: 1203  Post Infusion IV Care: Peripheral IV Discontinued  Discharge: Condition: Good, Destination: Home . AVS provided to patient.   Performed by:  Cleophus Molt, RN

## 2022-01-12 ENCOUNTER — Ambulatory Visit: Payer: Medicaid Other

## 2022-01-16 ENCOUNTER — Other Ambulatory Visit: Payer: Self-pay | Admitting: Family Medicine

## 2022-01-16 DIAGNOSIS — Z1231 Encounter for screening mammogram for malignant neoplasm of breast: Secondary | ICD-10-CM

## 2022-01-17 ENCOUNTER — Ambulatory Visit (INDEPENDENT_AMBULATORY_CARE_PROVIDER_SITE_OTHER): Payer: Medicaid Other

## 2022-01-17 VITALS — BP 124/80 | HR 70 | Temp 98.5°F | Resp 18 | Ht 63.0 in | Wt 141.4 lb

## 2022-01-17 DIAGNOSIS — D500 Iron deficiency anemia secondary to blood loss (chronic): Secondary | ICD-10-CM

## 2022-01-17 MED ORDER — SODIUM CHLORIDE 0.9 % IV SOLN
300.0000 mg | INTRAVENOUS | Status: DC
  Administered 2022-01-17: 300 mg via INTRAVENOUS
  Filled 2022-01-17: qty 15

## 2022-01-17 NOTE — Progress Notes (Signed)
Diagnosis: Iron Deficiency Anemia  Provider:  Marshell Garfinkel, MD  Procedure: Infusion  IV Type: Peripheral, IV Location: R Hand  Venofer (Iron Sucrose), Dose: 300 mg  Infusion Start Time: 0911  Infusion Stop Time: 2919  Post Infusion IV Care: Peripheral IV Discontinued  Discharge: Condition: Good, Destination: Home . AVS provided to patient.   Performed by:  Adelina Mings, LPN

## 2022-01-31 ENCOUNTER — Ambulatory Visit: Payer: Medicaid Other

## 2022-02-03 ENCOUNTER — Ambulatory Visit
Admission: RE | Admit: 2022-02-03 | Discharge: 2022-02-03 | Disposition: A | Discharge status: Home / Self Care | Payer: Medicaid Other | Source: Ambulatory Visit | Attending: Family Medicine | Admitting: Family Medicine

## 2022-02-03 DIAGNOSIS — Z1231 Encounter for screening mammogram for malignant neoplasm of breast: Secondary | ICD-10-CM

## 2022-02-08 ENCOUNTER — Telehealth: Payer: Self-pay | Admitting: Internal Medicine

## 2022-02-08 NOTE — Telephone Encounter (Signed)
Called patient regarding upcoming August appointments, patient has been called and voicemail was left.

## 2022-03-14 ENCOUNTER — Inpatient Hospital Stay: Payer: Medicaid Other

## 2022-03-14 ENCOUNTER — Inpatient Hospital Stay: Payer: Medicaid Other | Admitting: Internal Medicine

## 2022-03-15 ENCOUNTER — Other Ambulatory Visit: Payer: Medicaid Other

## 2022-03-15 ENCOUNTER — Ambulatory Visit: Payer: Medicaid Other | Admitting: Internal Medicine

## 2022-03-20 ENCOUNTER — Inpatient Hospital Stay: Payer: Medicaid Other | Attending: Internal Medicine

## 2022-03-20 ENCOUNTER — Inpatient Hospital Stay: Payer: Medicaid Other | Admitting: Internal Medicine

## 2022-03-20 ENCOUNTER — Other Ambulatory Visit: Payer: Self-pay

## 2022-03-20 VITALS — BP 125/78 | HR 69 | Temp 98.2°F | Resp 17 | Wt 146.1 lb

## 2022-03-20 DIAGNOSIS — D509 Iron deficiency anemia, unspecified: Secondary | ICD-10-CM | POA: Diagnosis present

## 2022-03-20 DIAGNOSIS — D500 Iron deficiency anemia secondary to blood loss (chronic): Secondary | ICD-10-CM

## 2022-03-20 LAB — FERRITIN: Ferritin: 26 ng/mL (ref 11–307)

## 2022-03-20 LAB — CBC WITH DIFFERENTIAL (CANCER CENTER ONLY)
Abs Immature Granulocytes: 0.01 10*3/uL (ref 0.00–0.07)
Basophils Absolute: 0 10*3/uL (ref 0.0–0.1)
Basophils Relative: 1 %
Eosinophils Absolute: 0.1 10*3/uL (ref 0.0–0.5)
Eosinophils Relative: 3 %
HCT: 32 % — ABNORMAL LOW (ref 36.0–46.0)
Hemoglobin: 10.3 g/dL — ABNORMAL LOW (ref 12.0–15.0)
Immature Granulocytes: 0 %
Lymphocytes Relative: 34 %
Lymphs Abs: 1.3 10*3/uL (ref 0.7–4.0)
MCH: 26.6 pg (ref 26.0–34.0)
MCHC: 32.2 g/dL (ref 30.0–36.0)
MCV: 82.7 fL (ref 80.0–100.0)
Monocytes Absolute: 0.3 10*3/uL (ref 0.1–1.0)
Monocytes Relative: 9 %
Neutro Abs: 2 10*3/uL (ref 1.7–7.7)
Neutrophils Relative %: 53 %
Platelet Count: 308 10*3/uL (ref 150–400)
RBC: 3.87 MIL/uL (ref 3.87–5.11)
RDW: 15.8 % — ABNORMAL HIGH (ref 11.5–15.5)
WBC Count: 3.8 10*3/uL — ABNORMAL LOW (ref 4.0–10.5)
nRBC: 0 % (ref 0.0–0.2)

## 2022-03-20 LAB — IRON AND IRON BINDING CAPACITY (CC-WL,HP ONLY)
Iron: 59 ug/dL (ref 28–170)
Saturation Ratios: 17 % (ref 10.4–31.8)
TIBC: 354 ug/dL (ref 250–450)
UIBC: 295 ug/dL (ref 148–442)

## 2022-03-20 NOTE — Progress Notes (Signed)
[facility] [phone]   [phone]  OFFICE PROGRESS NOTE  Scifres, Earlie Server, PA-C (Inactive) No address on file  DIAGNOSIS:  History of sickle cell trait as well as iron deficiency anemia that has been going on for several years with no improvement in the oral iron tablets.  PRIOR THERAPY: Iron infusion with Venofer 300 mg IV weekly for 3 weeks.  CURRENT THERAPY: Over-the-counter ferrous sulfate with vitamin C once daily.  INTERVAL HISTORY: Lindsay Wood 46 y.o. female returns to the clinic today for follow-up visit.  The patient is feeling fine today with no concerning complaints except for mild fatigue.  She denied having any current chest pain, shortness of breath, cough or hemoptysis.  She denied having any fever or chills.  She has no nausea, vomiting, diarrhea or constipation.  She continues to tolerate the oral iron tablet fairly well.  She was treated with iron infusion in May 2023.  The patient is here today for evaluation and repeat blood work.  MEDICAL HISTORY: Past Medical History:  Diagnosis Date   Acid reflux    Dyslipidemia    Sickle cell trait ([place])     ALLERGIES:  has No Known Allergies.  MEDICATIONS:  Current Outpatient Medications  Medication Sig Dispense Refill   Cholecalciferol (VITAMIN D3) 50 MCG (2000 UT) TABS Take 50 mcg by mouth daily.     Cyanocobalamin (VITAMIN B12) 1000 MCG TBCR Take 1,000 mg by mouth daily.     OVER THE COUNTER MEDICATION Take 1 capsule by mouth daily. Black seed caplets     No current facility-administered medications for this visit.    SURGICAL HISTORY:  Past Surgical History:  Procedure Laterality Date   BREAST BIOPSY Right 08/12/2018   benign   BREAST BIOPSY Right 02/27/2019   benign   Right breast biopsy      REVIEW OF SYSTEMS:  A comprehensive review of systems was negative except for: Constitutional: positive for fatigue   PHYSICAL EXAMINATION: General appearance: alert,  cooperative, fatigued, and no distress Head: Normocephalic, without obvious abnormality, atraumatic Neck: no adenopathy, no JVD, supple, symmetrical, trachea midline, and thyroid not enlarged, symmetric, no tenderness/mass/nodules Lymph nodes: Cervical, supraclavicular, and axillary nodes normal. Resp: clear to auscultation bilaterally Back: symmetric, no curvature. ROM normal. No CVA tenderness. Cardio: regular rate and rhythm, S1, S2 normal, no murmur, click, rub or gallop GI: soft, non-tender; bowel sounds normal; no masses,  no organomegaly Extremities: extremities normal, atraumatic, no cyanosis or edema  ECOG PERFORMANCE STATUS: 1 - Symptomatic but completely ambulatory  Blood pressure 125/78, pulse 69, temperature 98.2 F (36.8 C), temperature source Oral, resp. rate 17, weight 146 lb 2 oz (66.3 kg), SpO2 100 %.  LABORATORY DATA: Lab Results  Component Value Date   WBC 3.8 (L) 03/20/2022   HGB 10.3 (L) 03/20/2022   HCT 32.0 (L) 03/20/2022   MCV 82.7 03/20/2022   PLT 308 03/20/2022      Chemistry      Component Value Date/Time   NA 137 12/12/2021 1147   K 3.5 12/12/2021 1147   CL 103 12/12/2021 1147   CO2 31 12/12/2021 1147   BUN 11 12/12/2021 1147   CREATININE 0.85 12/12/2021 1147      Component Value Date/Time   CALCIUM 9.3 12/12/2021 1147   ALKPHOS 40 12/12/2021 1147   AST 16 12/12/2021 1147   ALT 9 12/12/2021 1147   BILITOT 0.4 12/12/2021 1147       RADIOGRAPHIC  STUDIES: No results found.  ASSESSMENT AND PLAN: This is a very pleasant 46 years old African-American female with persistent microcytic anemia secondary to sickle cell trait as well as iron deficiency.  The patient was treated with iron infusion with Venofer 300 mg weekly for 3 weeks and tolerated it fairly well.  She also continues to take her oral iron tablet at regular basis. Repeat CBC today showed some mild improvement in her anemia with hemoglobin up to 10.3 and hematocrit 32.0%.  Platelets  count are normal.  She also continues to have mild leukocytopenia. I recommended for the patient to continue her current treatment with the oral iron tablets for now.  The iron study and ferritin are still pending.  If there is significant iron deficiency, I will arrange for the patient to receive the iron infusion with Venofer for 3 weeks. I will see her back for follow-up visit in 3 months for evaluation and repeat blood work. The patient was advised to call immediately if she has any other concerning symptoms in the interval. The patient voices understanding of current disease status and treatment options and is in agreement with the current care plan.  All questions were answered. The patient knows to call the clinic with any problems, questions or concerns. We can certainly see the patient much sooner if necessary. The total time spent in the appointment was 20 minutes.  Disclaimer: This note was dictated with voice recognition software. Similar sounding words can inadvertently be transcribed and may not be corrected upon review.

## 2022-11-24 ENCOUNTER — Encounter: Payer: Self-pay | Admitting: Family Medicine

## 2022-11-24 ENCOUNTER — Other Ambulatory Visit: Payer: Self-pay | Admitting: Family Medicine

## 2022-11-24 DIAGNOSIS — R198 Other specified symptoms and signs involving the digestive system and abdomen: Secondary | ICD-10-CM

## 2022-11-27 ENCOUNTER — Ambulatory Visit
Admission: RE | Admit: 2022-11-27 | Discharge: 2022-11-27 | Disposition: A | Discharge status: Home / Self Care | Payer: BC Managed Care – PPO | Source: Ambulatory Visit | Attending: Family Medicine | Admitting: Family Medicine

## 2022-11-27 DIAGNOSIS — R198 Other specified symptoms and signs involving the digestive system and abdomen: Secondary | ICD-10-CM

## 2022-11-27 MED ORDER — IOPAMIDOL (ISOVUE-300) INJECTION 61%
100.0000 mL | Freq: Once | INTRAVENOUS | Status: AC | PRN
  Administered 2022-11-27: 100 mL via INTRAVENOUS

## 2022-11-28 ENCOUNTER — Other Ambulatory Visit: Payer: Self-pay | Admitting: Family Medicine

## 2022-11-28 DIAGNOSIS — R1900 Intra-abdominal and pelvic swelling, mass and lump, unspecified site: Secondary | ICD-10-CM

## 2022-11-28 DIAGNOSIS — K769 Liver disease, unspecified: Secondary | ICD-10-CM

## 2022-12-04 ENCOUNTER — Other Ambulatory Visit: Payer: Self-pay | Admitting: Family Medicine

## 2022-12-04 DIAGNOSIS — K769 Liver disease, unspecified: Secondary | ICD-10-CM

## 2022-12-07 ENCOUNTER — Other Ambulatory Visit: Payer: Self-pay | Admitting: Family Medicine

## 2022-12-07 ENCOUNTER — Ambulatory Visit
Admission: RE | Admit: 2022-12-07 | Discharge: 2022-12-07 | Disposition: A | Discharge status: Home / Self Care | Payer: BC Managed Care – PPO | Source: Ambulatory Visit | Attending: Family Medicine | Admitting: Family Medicine

## 2022-12-07 DIAGNOSIS — R1900 Intra-abdominal and pelvic swelling, mass and lump, unspecified site: Secondary | ICD-10-CM

## 2022-12-07 MED ORDER — GADOPICLENOL 0.5 MMOL/ML IV SOLN
6.0000 mL | Freq: Once | INTRAVENOUS | Status: AC | PRN
  Administered 2022-12-07: 6 mL via INTRAVENOUS

## 2022-12-08 ENCOUNTER — Other Ambulatory Visit: Payer: Self-pay | Admitting: Family Medicine

## 2022-12-08 DIAGNOSIS — K769 Liver disease, unspecified: Secondary | ICD-10-CM

## 2022-12-26 ENCOUNTER — Encounter: Payer: Self-pay | Admitting: Family Medicine

## 2023-01-16 ENCOUNTER — Ambulatory Visit
Admission: RE | Admit: 2023-01-16 | Discharge: 2023-01-16 | Disposition: A | Discharge status: Home / Self Care | Payer: BC Managed Care – PPO | Source: Ambulatory Visit | Attending: Family Medicine | Admitting: Family Medicine

## 2023-01-16 DIAGNOSIS — K769 Liver disease, unspecified: Secondary | ICD-10-CM

## 2023-01-16 MED ORDER — GADOPICLENOL 0.5 MMOL/ML IV SOLN
7.0000 mL | Freq: Once | INTRAVENOUS | Status: AC | PRN
  Administered 2023-01-16: 7 mL via INTRAVENOUS

## 2023-02-12 ENCOUNTER — Other Ambulatory Visit: Payer: Self-pay | Admitting: Family Medicine

## 2023-02-12 DIAGNOSIS — Z1231 Encounter for screening mammogram for malignant neoplasm of breast: Secondary | ICD-10-CM

## 2023-02-23 ENCOUNTER — Ambulatory Visit
Admission: RE | Admit: 2023-02-23 | Discharge: 2023-02-23 | Disposition: A | Discharge status: Home / Self Care | Payer: BC Managed Care – PPO | Source: Ambulatory Visit | Attending: Family Medicine | Admitting: Family Medicine

## 2023-02-23 DIAGNOSIS — Z1231 Encounter for screening mammogram for malignant neoplasm of breast: Secondary | ICD-10-CM

## 2023-07-31 ENCOUNTER — Other Ambulatory Visit: Payer: Self-pay | Admitting: Family Medicine

## 2023-07-31 DIAGNOSIS — K769 Liver disease, unspecified: Secondary | ICD-10-CM

## 2023-08-31 ENCOUNTER — Encounter: Payer: Self-pay | Admitting: Family Medicine

## 2023-09-15 ENCOUNTER — Ambulatory Visit
Admission: RE | Admit: 2023-09-15 | Discharge: 2023-09-15 | Disposition: A | Discharge status: Home / Self Care | Payer: 59 | Source: Ambulatory Visit | Attending: Family Medicine | Admitting: Family Medicine

## 2023-09-15 DIAGNOSIS — K769 Liver disease, unspecified: Secondary | ICD-10-CM

## 2023-09-15 MED ORDER — GADOPICLENOL 0.5 MMOL/ML IV SOLN
6.0000 mL | Freq: Once | INTRAVENOUS | Status: AC | PRN
  Administered 2023-09-15: 6 mL via INTRAVENOUS

## 2024-01-15 ENCOUNTER — Other Ambulatory Visit: Payer: Self-pay | Admitting: Family Medicine

## 2024-01-15 DIAGNOSIS — E279 Disorder of adrenal gland, unspecified: Secondary | ICD-10-CM

## 2024-01-25 ENCOUNTER — Ambulatory Visit
Admission: RE | Admit: 2024-01-25 | Discharge: 2024-01-25 | Disposition: A | Discharge status: Home / Self Care | Source: Ambulatory Visit | Attending: Family Medicine | Admitting: Family Medicine

## 2024-01-25 DIAGNOSIS — E279 Disorder of adrenal gland, unspecified: Secondary | ICD-10-CM

## 2024-01-25 MED ORDER — IOPAMIDOL (ISOVUE-300) INJECTION 61%
100.0000 mL | Freq: Once | INTRAVENOUS | Status: AC | PRN
  Administered 2024-01-25: 100 mL via INTRAVENOUS

## 2024-03-21 ENCOUNTER — Other Ambulatory Visit: Payer: Self-pay | Admitting: Family Medicine

## 2024-03-21 DIAGNOSIS — Z1231 Encounter for screening mammogram for malignant neoplasm of breast: Secondary | ICD-10-CM

## 2024-03-26 ENCOUNTER — Ambulatory Visit
Admission: RE | Admit: 2024-03-26 | Discharge: 2024-03-26 | Disposition: A | Discharge status: Home / Self Care | Source: Ambulatory Visit | Attending: Family Medicine | Admitting: Family Medicine

## 2024-03-26 DIAGNOSIS — Z1231 Encounter for screening mammogram for malignant neoplasm of breast: Secondary | ICD-10-CM

## 2024-04-01 ENCOUNTER — Other Ambulatory Visit: Payer: Self-pay | Admitting: Nurse Practitioner

## 2024-04-01 DIAGNOSIS — D376 Neoplasm of uncertain behavior of liver, gallbladder and bile ducts: Secondary | ICD-10-CM

## 2024-04-23 ENCOUNTER — Encounter: Payer: Self-pay | Admitting: Nurse Practitioner

## 2024-04-28 ENCOUNTER — Ambulatory Visit
Admission: RE | Admit: 2024-04-28 | Discharge: 2024-04-28 | Disposition: A | Discharge status: Home / Self Care | Source: Ambulatory Visit | Attending: Nurse Practitioner | Admitting: Nurse Practitioner

## 2024-04-28 DIAGNOSIS — D376 Neoplasm of uncertain behavior of liver, gallbladder and bile ducts: Secondary | ICD-10-CM

## 2024-04-28 MED ORDER — GADOXETATE DISODIUM 0.25 MMOL/ML IV SOLN
7.0000 mL | Freq: Once | INTRAVENOUS | Status: AC | PRN
  Administered 2024-04-28: 7 mL via INTRAVENOUS
